# Patient Record
Sex: Female | Born: 2000 | Race: White | Hispanic: No | Marital: Single | Smoking: Never smoker
Health system: Southern US, Community
[De-identification: ages and names within clinical notes are randomized; demographics above are authoritative.]

## PROBLEM LIST (undated history)

## (undated) DIAGNOSIS — N159 Renal tubulo-interstitial disease, unspecified: Secondary | ICD-10-CM

## (undated) DIAGNOSIS — N390 Urinary tract infection, site not specified: Secondary | ICD-10-CM

## (undated) HISTORY — PX: CYST EXCISION: SHX5701

---
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
## 2022-03-12 ENCOUNTER — Encounter (HOSPITAL_COMMUNITY): Payer: Self-pay

## 2022-03-12 ENCOUNTER — Inpatient Hospital Stay (HOSPITAL_COMMUNITY): Payer: PRIVATE HEALTH INSURANCE

## 2022-03-12 ENCOUNTER — Inpatient Hospital Stay (HOSPITAL_COMMUNITY)
Admission: AD | Admit: 2022-03-12 | Discharge: 2022-03-15 | DRG: 833 | Disposition: A | Discharge status: Home / Self Care | Payer: PRIVATE HEALTH INSURANCE | Attending: Obstetrics and Gynecology | Admitting: Obstetrics and Gynecology

## 2022-03-12 ENCOUNTER — Other Ambulatory Visit: Payer: Self-pay

## 2022-03-12 DIAGNOSIS — O2302 Infections of kidney in pregnancy, second trimester: Principal | ICD-10-CM | POA: Diagnosis present

## 2022-03-12 DIAGNOSIS — B961 Klebsiella pneumoniae [K. pneumoniae] as the cause of diseases classified elsewhere: Secondary | ICD-10-CM | POA: Diagnosis present

## 2022-03-12 DIAGNOSIS — O99012 Anemia complicating pregnancy, second trimester: Secondary | ICD-10-CM | POA: Diagnosis present

## 2022-03-12 DIAGNOSIS — Z3A18 18 weeks gestation of pregnancy: Secondary | ICD-10-CM | POA: Diagnosis not present

## 2022-03-12 DIAGNOSIS — Z3490 Encounter for supervision of normal pregnancy, unspecified, unspecified trimester: Secondary | ICD-10-CM

## 2022-03-12 LAB — CBC WITH DIFFERENTIAL/PLATELET
Abs Immature Granulocytes: 0.06 10*3/uL (ref 0.00–0.07)
Basophils Absolute: 0 10*3/uL (ref 0.0–0.1)
Basophils Relative: 0 %
Eosinophils Absolute: 0 10*3/uL (ref 0.0–0.5)
Eosinophils Relative: 0 %
HCT: 31.3 % — ABNORMAL LOW (ref 36.0–46.0)
Hemoglobin: 10.7 g/dL — ABNORMAL LOW (ref 12.0–15.0)
Immature Granulocytes: 0 %
Lymphocytes Relative: 5 %
Lymphs Abs: 0.8 10*3/uL (ref 0.7–4.0)
MCH: 31.5 pg (ref 26.0–34.0)
MCHC: 34.2 g/dL (ref 30.0–36.0)
MCV: 92.1 fL (ref 80.0–100.0)
Monocytes Absolute: 0.7 10*3/uL (ref 0.1–1.0)
Monocytes Relative: 5 %
Neutro Abs: 13.1 10*3/uL — ABNORMAL HIGH (ref 1.7–7.7)
Neutrophils Relative %: 90 %
Platelets: 219 10*3/uL (ref 150–400)
RBC: 3.4 MIL/uL — ABNORMAL LOW (ref 3.87–5.11)
RDW: 12 % (ref 11.5–15.5)
WBC: 14.7 10*3/uL — ABNORMAL HIGH (ref 4.0–10.5)
nRBC: 0 % (ref 0.0–0.2)

## 2022-03-12 LAB — URINALYSIS, ROUTINE W REFLEX MICROSCOPIC
Bilirubin Urine: NEGATIVE
Glucose, UA: NEGATIVE mg/dL
Ketones, ur: NEGATIVE mg/dL
Nitrite: POSITIVE — AB
Protein, ur: NEGATIVE mg/dL
Specific Gravity, Urine: 1.016 (ref 1.005–1.030)
WBC, UA: >50 WBC/hpf — ABNORMAL HIGH (ref 0–5)
pH: 6 (ref 5.0–8.0)

## 2022-03-12 LAB — COMPREHENSIVE METABOLIC PANEL
ALT: 12 U/L (ref 0–44)
AST: 16 U/L (ref 15–41)
Albumin: 3.3 g/dL — ABNORMAL LOW (ref 3.5–5.0)
Alkaline Phosphatase: 42 U/L (ref 38–126)
Anion gap: 5 (ref 5–15)
BUN: 6 mg/dL (ref 6–20)
CO2: 22 mmol/L (ref 22–32)
Calcium: 8.9 mg/dL (ref 8.9–10.3)
Chloride: 108 mmol/L (ref 98–111)
Creatinine, Ser: 0.47 mg/dL (ref 0.44–1.00)
GFR, Estimated: >60 mL/min (ref >60)
Glucose, Bld: 87 mg/dL (ref 70–99)
Potassium: 4.1 mmol/L (ref 3.5–5.1)
Sodium: 135 mmol/L (ref 135–145)
Total Bilirubin: 0.5 mg/dL (ref 0.3–1.2)
Total Protein: 5.7 g/dL — ABNORMAL LOW (ref 6.5–8.1)

## 2022-03-12 LAB — TYPE AND SCREEN
ABO/RH(D): O POS
Antibody Screen: NEGATIVE

## 2022-03-12 MED ORDER — CALCIUM CARBONATE ANTACID 500 MG PO CHEW: 2.0000 | CHEWABLE_TABLET | ORAL | Status: DC | PRN

## 2022-03-12 MED ORDER — ZOLPIDEM TARTRATE 5 MG PO TABS
5.0000 mg | ORAL_TABLET | Freq: Every evening | ORAL | Status: DC | PRN
  Administered 2022-03-13: 5 mg via ORAL
  Filled 2022-03-12: qty 1

## 2022-03-12 MED ORDER — FAMOTIDINE 20 MG PO TABS: 20.0000 mg | ORAL_TABLET | Freq: Two times a day (BID) | ORAL | Status: DC | PRN

## 2022-03-12 MED ORDER — SODIUM CHLORIDE 0.9 % IV BOLUS
1000.0000 mL | Freq: Once | INTRAVENOUS | Status: AC
  Administered 2022-03-12: 1000 mL via INTRAVENOUS

## 2022-03-12 MED ORDER — ONDANSETRON 4 MG PO TBDP
4.0000 mg | ORAL_TABLET | Freq: Three times a day (TID) | ORAL | Status: DC | PRN
  Administered 2022-03-12 – 2022-03-14 (×4): 4 mg via ORAL
  Filled 2022-03-12 (×4): qty 1

## 2022-03-12 MED ORDER — OXYCODONE-ACETAMINOPHEN 5-325 MG PO TABS
2.0000 | ORAL_TABLET | Freq: Once | ORAL | Status: AC
  Administered 2022-03-12: 2 via ORAL
  Filled 2022-03-12: qty 2

## 2022-03-12 MED ORDER — OXYCODONE HCL 5 MG PO TABS
5.0000 mg | ORAL_TABLET | ORAL | Status: DC | PRN
  Administered 2022-03-12: 5 mg via ORAL
  Filled 2022-03-12 (×2): qty 1

## 2022-03-12 MED ORDER — SODIUM CHLORIDE 0.9 % IV SOLN
2.0000 g | INTRAVENOUS | Status: DC
  Administered 2022-03-12 – 2022-03-13 (×2): 2 g via INTRAVENOUS
  Filled 2022-03-12 (×4): qty 20

## 2022-03-12 MED ORDER — PRENATAL MULTIVITAMIN CH
1.0000 | ORAL_TABLET | Freq: Every day | ORAL | Status: DC
  Administered 2022-03-13 – 2022-03-14 (×2): 1 via ORAL
  Filled 2022-03-12 (×2): qty 1

## 2022-03-12 MED ORDER — DOCUSATE SODIUM 100 MG PO CAPS
100.0000 mg | ORAL_CAPSULE | Freq: Every day | ORAL | Status: DC
  Administered 2022-03-14: 100 mg via ORAL
  Filled 2022-03-12 (×2): qty 1

## 2022-03-12 MED ORDER — ACETAMINOPHEN 500 MG PO TABS
1000.0000 mg | ORAL_TABLET | Freq: Four times a day (QID) | ORAL | Status: DC | PRN
  Administered 2022-03-12 – 2022-03-15 (×8): 1000 mg via ORAL
  Filled 2022-03-12 (×8): qty 2

## 2022-03-12 NOTE — H&P (Signed)
Alexis Padilla is a 21 y.o. female presenting for Pyelo. Please see triage HPI for more details. Reports constant back pain improved by pain meds in MAU. No overt dysuria, f/c, or other pain. ?OB History   ? ? Gravida  ?2  ? Para  ?   ? Term  ?   ? Preterm  ?   ? AB  ?1  ? Living  ?   ?  ? ? SAB  ?1  ? IAB  ?   ? Ectopic  ?   ? Multiple  ?   ? Live Births  ?   ?   ?  ?  ? ?History reviewed. No pertinent past medical history. ?Past Surgical History:  ?Procedure Laterality Date  ? CYST EXCISION    ? ?Family History: family history is not on file. ?Social History:  reports that she has never smoked. She has been exposed to tobacco smoke. She has never used smokeless tobacco. She reports current drug use. Drug: Marijuana. She reports that she does not drink alcohol. ?Review of Systems ?History ?  ?Blood pressure (!) 109/48, pulse 89, temperature 98.2 ?F (36.8 ?C), temperature source Oral, resp. rate 16, height 5\' 1"  (1.549 m), weight 58.4 kg, SpO2 99 %. ?Exam ?Physical Exam  ?NAD, A&O ?NWOB ?Abd soft, nondistended, gravid ?+ Left CVA tenderness - right none ? ?Prenatal labs: ?ABO, Rh: --/--/PENDING (05/01 1944) ?Antibody: PENDING (05/01 1944) ?Rubella:   ?RPR:    ?HBsAg:    ?HIV:    ?GBS:    ? ?Assessment/Plan: ?21 yo G2P0 presenting at 18.0 wga with pyelo.  ?AF but + CVA tenderness and UA dirty ?Daily FHT ?Daily CBC ?Ceftriaxone 2g q 24 hours ?F/u UCX ?Encouraged hydration  ? ? ?26 ?03/12/2022, 8:25 PM ? ? ? ? ?

## 2022-03-12 NOTE — MAU Provider Note (Addendum)
?History  ?  ? ?CSN: [id number] ? ?Arrival date and time: 03/12/22 1724 ? ? None  ?  ? ?Chief Complaint  ?Patient presents with  ? Back Pain  ? ?HPI ?This is a 21 year old G2, P0 at 18 weeks and 0 days who is seen with right flank pain radiates into her right abdomen.  Her pain started this morning.  Pain is fairly constant but waxes and wanes.  She tried to take Tylenol at the muscle relaxer, which did not help.  She denies fevers, chills, nausea, vomiting, diarrhea, constipation, hematuria, decreased appetite.  The pain is not improved with food or eating. ? ?OB History   ? ? Gravida  ?2  ? Para  ?   ? Term  ?   ? Preterm  ?   ? AB  ?1  ? Living  ?   ?  ? ? SAB  ?1  ? IAB  ?   ? Ectopic  ?   ? Multiple  ?   ? Live Births  ?   ?   ?  ?  ? ? ?History reviewed. No pertinent past medical history. ? ?Past Surgical History:  ?Procedure Laterality Date  ? CYST EXCISION    ? ? ?History reviewed. No pertinent family history. ? ?Social History  ? ?Tobacco Use  ? Smoking status: Never  ?  Passive exposure: Yes  ? Smokeless tobacco: Never  ?Vaping Use  ? Vaping Use: Every day  ?Substance Use Topics  ? Alcohol use: No  ? Drug use: Yes  ?  Types: Marijuana  ? ? ?Allergies:  ?Allergies  ?Allergen Reactions  ? Latex Rash  ? ? ?Medications Prior to Admission  ?Medication Sig Dispense Refill Last Dose  ? cephALEXin (KEFLEX) 500 MG capsule Take 1 capsule (500 mg total) by mouth 2 (two) times daily. 14 capsule 0   ? ibuprofen (ADVIL,MOTRIN) 200 MG tablet Take 400 mg by mouth every 6 (six) hours as needed for mild pain.     ? ibuprofen (ADVIL,MOTRIN) 400 MG tablet Take 1 tablet (400 mg total) by mouth every 6 (six) hours as needed. 30 tablet 0   ? methocarbamol (ROBAXIN) 500 MG tablet Take 1 tablet (500 mg total) by mouth 3 (three) times daily. 21 tablet 0   ? ? ?Review of Systems ?Physical Exam  ? ?Blood pressure (!) 109/48, pulse 89, temperature 98.2 ?F (36.8 ?C), temperature source Oral, resp. rate 16, height 5\' 1"  (1.549 m), weight  58.4 kg, SpO2 99 %. ? ?Physical Exam ?Vitals reviewed.  ?Constitutional:   ?   Appearance: Normal appearance.  ?HENT:  ?   Head: Normocephalic and atraumatic.  ?Cardiovascular:  ?   Rate and Rhythm: Normal rate and regular rhythm.  ?   Pulses: Normal pulses.  ?   Heart sounds: Normal heart sounds.  ?Pulmonary:  ?   Effort: Pulmonary effort is normal.  ?   Breath sounds: Normal breath sounds.  ?Abdominal:  ?   General: Abdomen is flat. There is no distension.  ?   Palpations: Abdomen is soft. There is no mass.  ?   Tenderness: There is no abdominal tenderness. There is right CVA tenderness. There is no left CVA tenderness, guarding or rebound.  ?   Hernia: No hernia is present.  ?Skin: ?   General: Skin is warm and dry.  ?   Capillary Refill: Capillary refill takes less than 2 seconds.  ?Neurological:  ?   General: No focal  deficit present.  ?   Mental Status: She is alert.  ?Psychiatric:     ?   Mood and Affect: Mood normal.     ?   Behavior: Behavior normal.     ?   Thought Content: Thought content normal.     ?   Judgment: Judgment normal.  ? ?Results for orders placed or performed during the hospital encounter of 03/12/22 (from the past 24 hour(s))  ?Urinalysis, Routine w reflex microscopic     Status: Abnormal  ? Collection Time: 03/12/22  5:24 PM  ?Result Value Ref Range  ? Color, Urine YELLOW YELLOW  ? APPearance CLOUDY (A) CLEAR  ? Specific Gravity, Urine 1.016 1.005 - 1.030  ? pH 6.0 5.0 - 8.0  ? Glucose, UA NEGATIVE NEGATIVE mg/dL  ? Hgb urine dipstick SMALL (A) NEGATIVE  ? Bilirubin Urine NEGATIVE NEGATIVE  ? Ketones, ur NEGATIVE NEGATIVE mg/dL  ? Protein, ur NEGATIVE NEGATIVE mg/dL  ? Nitrite POSITIVE (A) NEGATIVE  ? Leukocytes,Ua LARGE (A) NEGATIVE  ? RBC / HPF 21-50 0 - 5 RBC/hpf  ? WBC, UA >50 (H) 0 - 5 WBC/hpf  ? Bacteria, UA MANY (A) NONE SEEN  ? Squamous Epithelial / LPF 6-10 0 - 5  ? WBC Clumps PRESENT   ? Mucus PRESENT   ?Comprehensive metabolic panel     Status: Abnormal  ? Collection Time:  03/12/22  6:11 PM  ?Result Value Ref Range  ? Sodium 135 135 - 145 mmol/L  ? Potassium 4.1 3.5 - 5.1 mmol/L  ? Chloride 108 98 - 111 mmol/L  ? CO2 22 22 - 32 mmol/L  ? Glucose, Bld 87 70 - 99 mg/dL  ? BUN 6 6 - 20 mg/dL  ? Creatinine, Ser 0.47 0.44 - 1.00 mg/dL  ? Calcium 8.9 8.9 - 10.3 mg/dL  ? Total Protein 5.7 (L) 6.5 - 8.1 g/dL  ? Albumin 3.3 (L) 3.5 - 5.0 g/dL  ? AST 16 15 - 41 U/L  ? ALT 12 0 - 44 U/L  ? Alkaline Phosphatase 42 38 - 126 U/L  ? Total Bilirubin 0.5 0.3 - 1.2 mg/dL  ? GFR, Estimated >60 >60 mL/min  ? Anion gap 5 5 - 15  ?CBC with Differential/Platelet     Status: Abnormal  ? Collection Time: 03/12/22  6:11 PM  ?Result Value Ref Range  ? WBC 14.7 (H) 4.0 - 10.5 K/uL  ? RBC 3.40 (L) 3.87 - 5.11 MIL/uL  ? Hemoglobin 10.7 (L) 12.0 - 15.0 g/dL  ? HCT 31.3 (L) 36.0 - 46.0 %  ? MCV 92.1 80.0 - 100.0 fL  ? MCH 31.5 26.0 - 34.0 pg  ? MCHC 34.2 30.0 - 36.0 g/dL  ? RDW 12.0 11.5 - 15.5 %  ? Platelets 219 150 - 400 K/uL  ? nRBC 0.0 0.0 - 0.2 %  ? Neutrophils Relative % 90 %  ? Neutro Abs 13.1 (H) 1.7 - 7.7 K/uL  ? Lymphocytes Relative 5 %  ? Lymphs Abs 0.8 0.7 - 4.0 K/uL  ? Monocytes Relative 5 %  ? Monocytes Absolute 0.7 0.1 - 1.0 K/uL  ? Eosinophils Relative 0 %  ? Eosinophils Absolute 0.0 0.0 - 0.5 K/uL  ? Basophils Relative 0 %  ? Basophils Absolute 0.0 0.0 - 0.1 K/uL  ? Immature Granulocytes 0 %  ? Abs Immature Granulocytes 0.06 0.00 - 0.07 K/uL  ? ?US RENAL ? ?Result Date: 03/12/2022 ?CLINICAL DATA:  Eighteen weeks pregnant with right-sided back  pain. EXAM: RENAL / URINARY TRACT ULTRASOUND COMPLETE COMPARISON:  September 30, 2007 FINDINGS: Right Kidney: Renal measurements: 11.7 cm x 5.3 cm x 6.0 cm = volume: 196.5 mL. Echogenicity within normal limits. No mass is visualized. There is mild right-sided hydronephrosis. Left Kidney: Renal measurements: 11.5 cm x 5.2 cm x 5.4 cm = volume: 169.4 mL. Echogenicity within normal limits. No mass or hydronephrosis visualized. Bladder: Appears normal for degree of  bladder distention. Other: None. IMPRESSION: Mild right-sided hydronephrosis. Electronically Signed   By: Aram Candelahaddeus  Houston M.D.   On: 03/12/2022 18:59   ? ? ?MAU Course  ?Procedures ? ?MDM ?1800 With right CVA tenderness will need to rule out pyelonephritis and kidney stone.  Will check CBC, CMP, UA, renal ultrasound. ?We will give patient Percocet 10 mg ? ?Assessment and Plan  ? ?1. [redacted] weeks gestation of pregnancy   ?2. Pyelonephritis affecting pregnancy in second trimester   ? ?UTI consistent with infection. With CVA tenderness, likely early pyelo.  ?Rocephin 2g now ?Start IV ?NS ? ?Discussed patient with Dr Elon SpannerLeger, who agreed with admission.  ? ? ?Levie HeritageJacob J Vonita Calloway ?03/12/2022, 6:03 PM  ?

## 2022-03-12 NOTE — MAU Note (Signed)
Alexis Padilla is a 21 y.o. at [redacted]w[redacted]d here in MAU reporting: right sided back pain that radiates in to her abdomen. It started around 0400. Has taken 1 tylenol and 1 muscle relaxer with no relief. Pain is constant but the intensity changes. No bleeding, LOF, or discharge. ? ?Onset of complaint: today ? ?Pain score: 6/10 ? ?Vitals:  ? 03/12/22 1749  ?BP: (!) 109/48  ?Pulse: 89  ?Resp: 16  ?Temp: 98.2 ?F (36.8 ?C)  ?SpO2: 99%  ?   ?FHT:162 ? ?Lab orders placed from triage: UA ? ?

## 2022-03-13 LAB — CBC
HCT: 26.8 % — ABNORMAL LOW (ref 36.0–46.0)
Hemoglobin: 9.4 g/dL — ABNORMAL LOW (ref 12.0–15.0)
MCH: 31.9 pg (ref 26.0–34.0)
MCHC: 35.1 g/dL (ref 30.0–36.0)
MCV: 90.8 fL (ref 80.0–100.0)
Platelets: 185 10*3/uL (ref 150–400)
RBC: 2.95 MIL/uL — ABNORMAL LOW (ref 3.87–5.11)
RDW: 11.9 % (ref 11.5–15.5)
WBC: 13.3 10*3/uL — ABNORMAL HIGH (ref 4.0–10.5)
nRBC: 0 % (ref 0.0–0.2)

## 2022-03-13 MED ORDER — FERROUS SULFATE 325 (65 FE) MG PO TABS
325.0000 mg | ORAL_TABLET | Freq: Every day | ORAL | Status: DC
  Administered 2022-03-14: 325 mg via ORAL
  Filled 2022-03-13: qty 1

## 2022-03-13 MED ORDER — CYCLOBENZAPRINE HCL 10 MG PO TABS
10.0000 mg | ORAL_TABLET | Freq: Once | ORAL | Status: AC
  Administered 2022-03-13: 10 mg via ORAL
  Filled 2022-03-13: qty 1

## 2022-03-13 MED ORDER — OXYCODONE HCL 5 MG PO TABS
5.0000 mg | ORAL_TABLET | ORAL | Status: DC | PRN
  Administered 2022-03-13 (×3): 10 mg via ORAL
  Administered 2022-03-14: 5 mg via ORAL
  Filled 2022-03-13: qty 1
  Filled 2022-03-13 (×3): qty 2

## 2022-03-13 NOTE — Progress Notes (Addendum)
Patient ID: DENECIA BRUNETTE, female   DOB: June 07, 2001, 21 y.o.   MRN: [id number] ? ?No c/o this AM.  Right flank pain is improved with medication; 10 mg oxi at 0600.  She denies CTX or VB.  No CP/SOB.  No N/V with exception of last night when Abx were initiated. ? ? ?  03/13/2022  ?  8:15 AM 03/13/2022  ?  6:23 AM 03/12/2022  ? 11:44 PM  ?Vitals with BMI  ?Systolic 99 94 108  ?Diastolic 43 45 41  ?Pulse 70 79 80  ? ? ?  Latest Ref Rng & Units 03/13/2022  ?  5:03 AM 03/12/2022  ?  6:11 PM 10/16/2019  ?  2:05 PM  ?CBC  ?WBC 4.0 - 10.5 K/uL 13.3   14.7   8.5    ?Hemoglobin 12.0 - 15.0 g/dL 9.4   35.3   61.4    ?Hematocrit 36.0 - 46.0 % 26.8   31.3   41.2    ?Platelets 150 - 400 K/uL 185   219   227    ? ?Gen: A&O x 3 ?Abd: soft, gravid ?MSK: right CVA tenderness; per patient improved from last exam ?Ext: no c/c/e ? ?HD2: 20yo G2P0 at [redacted]w[redacted]d with right pyelo ?-Ceftriaxone 2g q 24h ?-Continue pain control; pain improved ?-Continue daily labs; WBC 14.7->13.3 ?-Afebrile throughout stay; continue to monitor T ?-Daily FHT ?-Anemia-FeSO4 started ?-Awaiting U cx to determine po abx on d/c ? ?Mitchel Honour, DO ?

## 2022-03-14 LAB — CBC WITH DIFFERENTIAL/PLATELET
Abs Immature Granulocytes: 0.02 10*3/uL (ref 0.00–0.07)
Basophils Absolute: 0 10*3/uL (ref 0.0–0.1)
Basophils Relative: 0 %
Eosinophils Absolute: 0.1 10*3/uL (ref 0.0–0.5)
Eosinophils Relative: 1 %
HCT: 25.4 % — ABNORMAL LOW (ref 36.0–46.0)
Hemoglobin: 8.7 g/dL — ABNORMAL LOW (ref 12.0–15.0)
Immature Granulocytes: 0 %
Lymphocytes Relative: 29 %
Lymphs Abs: 2 10*3/uL (ref 0.7–4.0)
MCH: 31.9 pg (ref 26.0–34.0)
MCHC: 34.3 g/dL (ref 30.0–36.0)
MCV: 93 fL (ref 80.0–100.0)
Monocytes Absolute: 0.7 10*3/uL (ref 0.1–1.0)
Monocytes Relative: 11 %
Neutro Abs: 4 10*3/uL (ref 1.7–7.7)
Neutrophils Relative %: 59 %
Platelets: 165 10*3/uL (ref 150–400)
RBC: 2.73 MIL/uL — ABNORMAL LOW (ref 3.87–5.11)
RDW: 12.1 % (ref 11.5–15.5)
WBC: 6.8 10*3/uL (ref 4.0–10.5)
nRBC: 0 % (ref 0.0–0.2)

## 2022-03-14 LAB — URINE CULTURE: Culture: >=100000 — AB

## 2022-03-14 MED ORDER — SULFAMETHOXAZOLE-TRIMETHOPRIM 800-160 MG PO TABS
1.0000 | ORAL_TABLET | Freq: Two times a day (BID) | ORAL | Status: DC
  Administered 2022-03-14 (×2): 1 via ORAL
  Filled 2022-03-14 (×3): qty 1

## 2022-03-14 NOTE — Progress Notes (Signed)
Antepartum Progress Note ? ? ?Alexis Padilla is a 21 y.o. female G2P0010 that is admitted to [facility] for pyelonephritis.  Overnight, she reports no acute events.  Her flank pain is improved. Took 5 mg oxycodone overnight (was previously using 10 mg dosing).  Nausea has improved as well.  ? ?Admits fetal movement, denies contractions, denies leakage of fluid, denies vaginal bleeding. ? ?History ?  ?Blood pressure (!) 104/50, pulse 76, temperature 98.3 ?F (36.8 ?C), temperature source Oral, resp. rate 16, height 5\' 1"  (1.549 m), weight 58.4 kg, SpO2 100 %. ?Exam ? ?Physical Exam: ?Gen: Alert, well appearing, no distress ?Chest: nonlabored breathing ?CV: no peripheral edema ?Abdomen: gravid, soft and nontender ?Ext: no evidence of DVT ? ?FHT: daily dopp tones ? ?Assessment/Plan: ?Admitted to [facility] for pyelonephritis ?Ceftriaxone 2g q 24h ?WBC improved 14.7 > 13.3 > 6.8 this AM ?Afebrile since admission ?Urine Culture prelim with GNR, susceptibilities pending.  Will transition to oral when result is finalized. ?Pain control - oxycodone, tylenol ?Anemia - PO Fe ?Daily dopp tones ?Diet: regular ?DVT Ppx: SCDs ?  ? ?[facility] ?03/14/2022, 7:20 AM ? ? ? ? ?

## 2022-03-15 LAB — CBC WITH DIFFERENTIAL/PLATELET
Abs Immature Granulocytes: 0.03 10*3/uL (ref 0.00–0.07)
Basophils Absolute: 0 10*3/uL (ref 0.0–0.1)
Basophils Relative: 0 %
Eosinophils Absolute: 0.1 10*3/uL (ref 0.0–0.5)
Eosinophils Relative: 1 %
HCT: 25.7 % — ABNORMAL LOW (ref 36.0–46.0)
Hemoglobin: 8.9 g/dL — ABNORMAL LOW (ref 12.0–15.0)
Immature Granulocytes: 0 %
Lymphocytes Relative: 19 %
Lymphs Abs: 1.3 10*3/uL (ref 0.7–4.0)
MCH: 31.8 pg (ref 26.0–34.0)
MCHC: 34.6 g/dL (ref 30.0–36.0)
MCV: 91.8 fL (ref 80.0–100.0)
Monocytes Absolute: 0.5 10*3/uL (ref 0.1–1.0)
Monocytes Relative: 8 %
Neutro Abs: 4.8 10*3/uL (ref 1.7–7.7)
Neutrophils Relative %: 72 %
Platelets: 171 10*3/uL (ref 150–400)
RBC: 2.8 MIL/uL — ABNORMAL LOW (ref 3.87–5.11)
RDW: 11.8 % (ref 11.5–15.5)
WBC: 6.7 10*3/uL (ref 4.0–10.5)
nRBC: 0 % (ref 0.0–0.2)

## 2022-03-15 MED ORDER — SULFAMETHOXAZOLE-TRIMETHOPRIM 800-160 MG PO TABS: 1.0000 | ORAL_TABLET | Freq: Two times a day (BID) | ORAL | 0 refills | Status: AC

## 2022-03-15 MED ORDER — FERROUS SULFATE 325 (65 FE) MG PO TABS: 325.0000 mg | ORAL_TABLET | Freq: Every day | ORAL | 3 refills | Status: AC

## 2022-03-15 MED ORDER — PRENATAL MULTIVITAMIN CH: 1.0000 | ORAL_TABLET | Freq: Every day | ORAL | 3 refills | Status: AC

## 2022-03-15 NOTE — Progress Notes (Signed)
Feels good, wants to go home ?Nose bleed this morning, also some bleeding from gums with brushing. ? ?Today's Vitals  ? 03/15/22 0400 03/15/22 0531 03/15/22 0618 03/15/22 0741  ?BP:    (!) 108/55  ?Pulse:    79  ?Resp:    16  ?Temp:    98.3 ?F (36.8 ?C)  ?TempSrc:    Oral  ?SpO2:    100%  ?Weight:      ?Height:      ?PainSc: Asleep Asleep 5    ? ?Body mass index is 24.34 kg/m?Marland Kitchen ?Small nose bleed stopping with pressure ?Back without CVAT ? ?Results for orders placed or performed during the hospital encounter of 03/12/22 (from the past 72 hour(s))  ?Urinalysis, Routine w reflex microscopic     Status: Abnormal  ? Collection Time: 03/12/22  5:24 PM  ?Result Value Ref Range  ? Color, Urine YELLOW YELLOW  ? APPearance CLOUDY (A) CLEAR  ? Specific Gravity, Urine 1.016 1.005 - 1.030  ? pH 6.0 5.0 - 8.0  ? Glucose, UA NEGATIVE NEGATIVE mg/dL  ? Hgb urine dipstick SMALL (A) NEGATIVE  ? Bilirubin Urine NEGATIVE NEGATIVE  ? Ketones, ur NEGATIVE NEGATIVE mg/dL  ? Protein, ur NEGATIVE NEGATIVE mg/dL  ? Nitrite POSITIVE (A) NEGATIVE  ? Leukocytes,Ua LARGE (A) NEGATIVE  ? RBC / HPF 21-50 0 - 5 RBC/hpf  ? WBC, UA >50 (H) 0 - 5 WBC/hpf  ? Bacteria, UA MANY (A) NONE SEEN  ? Squamous Epithelial / LPF 6-10 0 - 5  ? WBC Clumps PRESENT   ? Mucus PRESENT   ?  Comment: Performed at [facility], [address] [place], [place] [address]  ?Urine Culture     Status: Abnormal  ? Collection Time: 03/12/22  5:24 PM  ? Specimen: Urine, Clean Catch  ?Result Value Ref Range  ? Specimen Description URINE, CLEAN CATCH   ? Special Requests    ?  NONE ?Performed at [facility], [address] [place], [place] [address] ?  ? Culture >=100,000 COLONIES/mL KLEBSIELLA PNEUMONIAE (A)   ? Report Status 03/14/2022 FINAL   ? Organism ID, Bacteria KLEBSIELLA PNEUMONIAE (A)   ?    Susceptibility  ? Klebsiella pneumoniae - MIC*  ?  AMPICILLIN RESISTANT Resistant   ?  CEFAZOLIN <=4 SENSITIVE Sensitive   ?  CEFEPIME <=0.12 SENSITIVE Sensitive   ?   CEFTRIAXONE <=0.25 SENSITIVE Sensitive   ?  CIPROFLOXACIN <=0.25 SENSITIVE Sensitive   ?  GENTAMICIN <=1 SENSITIVE Sensitive   ?  IMIPENEM <=0.25 SENSITIVE Sensitive   ?  NITROFURANTOIN 128 RESISTANT Resistant   ?  TRIMETH/SULFA <=20 SENSITIVE Sensitive   ?  AMPICILLIN/SULBACTAM 4 SENSITIVE Sensitive   ?  PIP/TAZO <=4 SENSITIVE Sensitive   ?  * >=100,000 COLONIES/mL KLEBSIELLA PNEUMONIAE  ?Comprehensive metabolic panel     Status: Abnormal  ? Collection Time: 03/12/22  6:11 PM  ?Result Value Ref Range  ? Sodium 135 135 - 145 mmol/L  ? Potassium 4.1 3.5 - 5.1 mmol/L  ? Chloride 108 98 - 111 mmol/L  ? CO2 22 22 - 32 mmol/L  ? Glucose, Bld 87 70 - 99 mg/dL  ?  Comment: Glucose reference range applies only to samples taken after fasting for at least 8 hours.  ? BUN 6 6 - 20 mg/dL  ? Creatinine, Ser 0.47 0.44 - 1.00 mg/dL  ? Calcium 8.9 8.9 - 10.3 mg/dL  ? Total Protein 5.7 (L) 6.5 - 8.1  g/dL  ? Albumin 3.3 (L) 3.5 - 5.0 g/dL  ? AST 16 15 - 41 U/L  ? ALT 12 0 - 44 U/L  ? Alkaline Phosphatase 42 38 - 126 U/L  ? Total Bilirubin 0.5 0.3 - 1.2 mg/dL  ? GFR, Estimated >60 >60 mL/min  ?  Comment: (NOTE) ?Calculated using the CKD-EPI Creatinine Equation (2021) ?  ? Anion gap 5 5 - 15  ?  Comment: Performed at [facility], [address], [place], [place] [address]  ?CBC with Differential/Platelet     Status: Abnormal  ? Collection Time: 03/12/22  6:11 PM  ?Result Value Ref Range  ? WBC 14.7 (H) 4.0 - 10.5 K/uL  ? RBC 3.40 (L) 3.87 - 5.11 MIL/uL  ? Hemoglobin 10.7 (L) 12.0 - 15.0 g/dL  ? HCT 31.3 (L) 36.0 - 46.0 %  ? MCV 92.1 80.0 - 100.0 fL  ? MCH 31.5 26.0 - 34.0 pg  ? MCHC 34.2 30.0 - 36.0 g/dL  ? RDW 12.0 11.5 - 15.5 %  ? Platelets 219 150 - 400 K/uL  ? nRBC 0.0 0.0 - 0.2 %  ? Neutrophils Relative % 90 %  ? Neutro Abs 13.1 (H) 1.7 - 7.7 K/uL  ? Lymphocytes Relative 5 %  ? Lymphs Abs 0.8 0.7 - 4.0 K/uL  ? Monocytes Relative 5 %  ? Monocytes Absolute 0.7 0.1 - 1.0 K/uL  ? Eosinophils Relative 0 %  ? Eosinophils Absolute  0.0 0.0 - 0.5 K/uL  ? Basophils Relative 0 %  ? Basophils Absolute 0.0 0.0 - 0.1 K/uL  ? Immature Granulocytes 0 %  ? Abs Immature Granulocytes 0.06 0.00 - 0.07 K/uL  ?  Comment: Performed at [facility], [address] [place], [place] [address]  ?Type and screen [facility]     Status: None  ? Collection Time: 03/12/22  7:44 PM  ?Result Value Ref Range  ? ABO/RH(D) O POS   ? Antibody Screen NEG   ? Sample Expiration    ?  03/15/2022,2359 ?Performed at [facility], [address] [place], [place] [address] ?  ?CBC     Status: Abnormal  ? Collection Time: 03/13/22  5:03 AM  ?Result Value Ref Range  ? WBC 13.3 (H) 4.0 - 10.5 K/uL  ? RBC 2.95 (L) 3.87 - 5.11 MIL/uL  ? Hemoglobin 9.4 (L) 12.0 - 15.0 g/dL  ? HCT 26.8 (L) 36.0 - 46.0 %  ? MCV 90.8 80.0 - 100.0 fL  ? MCH 31.9 26.0 - 34.0 pg  ? MCHC 35.1 30.0 - 36.0 g/dL  ? RDW 11.9 11.5 - 15.5 %  ? Platelets 185 150 - 400 K/uL  ? nRBC 0.0 0.0 - 0.2 %  ?  Comment: Performed at [facility], [address] [place], [place] [address]  ?CBC with Differential/Platelet     Status: Abnormal  ? Collection Time: 03/14/22  4:16 AM  ?Result Value Ref Range  ? WBC 6.8 4.0 - 10.5 K/uL  ? RBC 2.73 (L) 3.87 - 5.11 MIL/uL  ? Hemoglobin 8.7 (L) 12.0 - 15.0 g/dL  ? HCT 25.4 (L) 36.0 - 46.0 %  ? MCV 93.0 80.0 - 100.0 fL  ? MCH 31.9 26.0 - 34.0 pg  ? MCHC 34.3 30.0 - 36.0 g/dL  ? RDW 12.1 11.5 - 15.5 %  ? Platelets 165 150 - 400 K/uL  ? nRBC 0.0 0.0 - 0.2 %  ?  Neutrophils Relative % 59 %  ? Neutro Abs 4.0 1.7 - 7.7 K/uL  ? Lymphocytes Relative 29 %  ? Lymphs Abs 2.0 0.7 - 4.0 K/uL  ? Monocytes Relative 11 %  ? Monocytes Absolute 0.7 0.1 - 1.0 K/uL  ? Eosinophils Relative 1 %  ? Eosinophils Absolute 0.1 0.0 - 0.5 K/uL  ? Basophils Relative 0 %  ? Basophils Absolute 0.0 0.0 - 0.1 K/uL  ? Immature Granulocytes 0 %  ? Abs Immature Granulocytes 0.02 0.00 - 0.07 K/uL  ?  Comment: Performed at [facility], [address] [place], [place] [address]   ?CBC with Differential/Platelet     Status: Abnormal  ? Collection Time: 03/15/22  7:04 AM  ?Result Value Ref Range  ? WBC 6.7 4.0 - 10.5 K/uL  ? RBC 2.80 (L) 3.87 - 5.11 MIL/uL  ? Hemoglobin 8.9 (L) 12.0 - 15.0 g/dL  ? HCT 25.7 (L) 36.0 - 46.0 %  ? MCV 91.8 80.0 - 100.0 fL  ? MCH 31.8 26.0 - 34.0 pg  ? MCHC 34.6 30.0 - 36.0 g/dL  ? RDW 11.8 11.5 - 15.5 %  ? Platelets 171 150 - 400 K/uL  ? nRBC 0.0 0.0 - 0.2 %  ? Neutrophils Relative % 72 %  ? Neutro Abs 4.8 1.7 - 7.7 K/uL  ? Lymphocytes Relative 19 %  ? Lymphs Abs 1.3 0.7 - 4.0 K/uL  ? Monocytes Relative 8 %  ? Monocytes Absolute 0.5 0.1 - 1.0 K/uL  ? Eosinophils Relative 1 %  ? Eosinophils Absolute 0.1 0.0 - 0.5 K/uL  ? Basophils Relative 0 %  ? Basophils Absolute 0.0 0.0 - 0.1 K/uL  ? Immature Granulocytes 0 %  ? Abs Immature Granulocytes 0.03 0.00 - 0.07 K/uL  ?  Comment: Performed at [facility], [address] [place], [place] [address]  ?  ? ?A/P: 18 3/7 weeks ?        Pyelonephritis responding to treatement ?          Continue Bactrim for 2 weeks total ? ?        Anemia-ferrous sulfate ?           D/W possible IV iron infusion later in pregnancy ? ?        FU next week in office ?

## 2022-03-15 NOTE — Progress Notes (Signed)
Discharge instructions and prescriptions given to pt. Discussed signs and symptoms to report to the MD, upcoming appointments, and meds. Pt verbalizes understanding and has no questions or concerns at this time. Pt discharged home from hospital in stable condition. 

## 2022-03-15 NOTE — Discharge Summary (Signed)
Physician Discharge Summary  ?Patient ID: ?Alexis Padilla ?MRN: [id number] ?DOB/AGE: 2000/12/26 21 y.o. ? ?Admit date: 03/12/2022 ?Discharge date: 03/15/2022 ? ?Admission Diagnoses:pyelonephritis ? ?Discharge Diagnoses:  ?Principal Problem: ?  Pregnancy ?pyelonephritis ? ?Discharged Condition: good ? ?Hospital Course: admitted for IV fluids, ceftriaxone, and pain control. She responds to treatment. Urine culture grows klebsiella sensitive to Bactrim. She is changed to Bactrim and is tolerating well. She is also anemic and will be treated with oral ferrous sulfate. ? ?Consults: None ? ?Significant Diagnostic Studies: labs:  ?Results for orders placed or performed during the hospital encounter of 03/12/22 (from the past 72 hour(s))  ?Urinalysis, Routine w reflex microscopic     Status: Abnormal  ? Collection Time: 03/12/22  5:24 PM  ?Result Value Ref Range  ? Color, Urine YELLOW YELLOW  ? APPearance CLOUDY (A) CLEAR  ? Specific Gravity, Urine 1.016 1.005 - 1.030  ? pH 6.0 5.0 - 8.0  ? Glucose, UA NEGATIVE NEGATIVE mg/dL  ? Hgb urine dipstick SMALL (A) NEGATIVE  ? Bilirubin Urine NEGATIVE NEGATIVE  ? Ketones, ur NEGATIVE NEGATIVE mg/dL  ? Protein, ur NEGATIVE NEGATIVE mg/dL  ? Nitrite POSITIVE (A) NEGATIVE  ? Leukocytes,Ua LARGE (A) NEGATIVE  ? RBC / HPF 21-50 0 - 5 RBC/hpf  ? WBC, UA >50 (H) 0 - 5 WBC/hpf  ? Bacteria, UA MANY (A) NONE SEEN  ? Squamous Epithelial / LPF 6-10 0 - 5  ? WBC Clumps PRESENT   ? Mucus PRESENT   ?  Comment: Performed at [facility], [address], [place], [place] [address]  ?Urine Culture     Status: Abnormal  ? Collection Time: 03/12/22  5:24 PM  ? Specimen: Urine, Clean Catch  ?Result Value Ref Range  ? Specimen Description URINE, CLEAN CATCH   ? Special Requests    ?  NONE ?Performed at [facility] Lab, [address], [place], [place] [address] ?  ? Culture >=100,000 COLONIES/mL KLEBSIELLA PNEUMONIAE (A)   ? Report Status 03/14/2022 FINAL   ? Organism ID, Bacteria KLEBSIELLA  PNEUMONIAE (A)   ?    Susceptibility  ? Klebsiella pneumoniae - MIC*  ?  AMPICILLIN RESISTANT Resistant   ?  CEFAZOLIN <=4 SENSITIVE Sensitive   ?  CEFEPIME <=0.12 SENSITIVE Sensitive   ?  CEFTRIAXONE <=0.25 SENSITIVE Sensitive   ?  CIPROFLOXACIN <=0.25 SENSITIVE Sensitive   ?  GENTAMICIN <=1 SENSITIVE Sensitive   ?  IMIPENEM <=0.25 SENSITIVE Sensitive   ?  NITROFURANTOIN 128 RESISTANT Resistant   ?  TRIMETH/SULFA <=20 SENSITIVE Sensitive   ?  AMPICILLIN/SULBACTAM 4 SENSITIVE Sensitive   ?  PIP/TAZO <=4 SENSITIVE Sensitive   ?  * >=100,000 COLONIES/mL KLEBSIELLA PNEUMONIAE  ?Comprehensive metabolic panel     Status: Abnormal  ? Collection Time: 03/12/22  6:11 PM  ?Result Value Ref Range  ? Sodium 135 135 - 145 mmol/L  ? Potassium 4.1 3.5 - 5.1 mmol/L  ? Chloride 108 98 - 111 mmol/L  ? CO2 22 22 - 32 mmol/L  ? Glucose, Bld 87 70 - 99 mg/dL  ?  Comment: Glucose reference range applies only to samples taken after fasting for at least 8 hours.  ? BUN 6 6 - 20 mg/dL  ? Creatinine, Ser 0.47 0.44 - 1.00 mg/dL  ? Calcium 8.9 8.9 - 10.3 mg/dL  ? Total Protein 5.7 (L) 6.5 - 8.1 g/dL  ? Albumin 3.3 (L) 3.5 - 5.0 g/dL  ? AST 16 15 -  41 U/L  ? ALT 12 0 - 44 U/L  ? Alkaline Phosphatase 42 38 - 126 U/L  ? Total Bilirubin 0.5 0.3 - 1.2 mg/dL  ? GFR, Estimated >60 >60 mL/min  ?  Comment: (NOTE) ?Calculated using the CKD-EPI Creatinine Equation (2021) ?  ? Anion gap 5 5 - 15  ?  Comment: Performed at [facility], [address] [place], [place] [address]  ?CBC with Differential/Platelet     Status: Abnormal  ? Collection Time: 03/12/22  6:11 PM  ?Result Value Ref Range  ? WBC 14.7 (H) 4.0 - 10.5 K/uL  ? RBC 3.40 (L) 3.87 - 5.11 MIL/uL  ? Hemoglobin 10.7 (L) 12.0 - 15.0 g/dL  ? HCT 31.3 (L) 36.0 - 46.0 %  ? MCV 92.1 80.0 - 100.0 fL  ? MCH 31.5 26.0 - 34.0 pg  ? MCHC 34.2 30.0 - 36.0 g/dL  ? RDW 12.0 11.5 - 15.5 %  ? Platelets 219 150 - 400 K/uL  ? nRBC 0.0 0.0 - 0.2 %  ? Neutrophils Relative % 90 %  ? Neutro Abs 13.1 (H) 1.7 -  7.7 K/uL  ? Lymphocytes Relative 5 %  ? Lymphs Abs 0.8 0.7 - 4.0 K/uL  ? Monocytes Relative 5 %  ? Monocytes Absolute 0.7 0.1 - 1.0 K/uL  ? Eosinophils Relative 0 %  ? Eosinophils Absolute 0.0 0.0 - 0.5 K/uL  ? Basophils Relative 0 %  ? Basophils Absolute 0.0 0.0 - 0.1 K/uL  ? Immature Granulocytes 0 %  ? Abs Immature Granulocytes 0.06 0.00 - 0.07 K/uL  ?  Comment: Performed at [facility], [address] [place], [place] [address]  ?Type and screen [facility]     Status: None  ? Collection Time: 03/12/22  7:44 PM  ?Result Value Ref Range  ? ABO/RH(D) O POS   ? Antibody Screen NEG   ? Sample Expiration    ?  03/15/2022,2359 ?Performed at [facility], [address] [place], [place] [address] ?  ?CBC     Status: Abnormal  ? Collection Time: 03/13/22  5:03 AM  ?Result Value Ref Range  ? WBC 13.3 (H) 4.0 - 10.5 K/uL  ? RBC 2.95 (L) 3.87 - 5.11 MIL/uL  ? Hemoglobin 9.4 (L) 12.0 - 15.0 g/dL  ? HCT 26.8 (L) 36.0 - 46.0 %  ? MCV 90.8 80.0 - 100.0 fL  ? MCH 31.9 26.0 - 34.0 pg  ? MCHC 35.1 30.0 - 36.0 g/dL  ? RDW 11.9 11.5 - 15.5 %  ? Platelets 185 150 - 400 K/uL  ? nRBC 0.0 0.0 - 0.2 %  ?  Comment: Performed at [facility]  Lab, [address] [place], [place] [address]  ?CBC with Differential/Platelet     Status: Abnormal  ? Collection Time: 03/14/22  4:16 AM  ?Result Value Ref Range  ? WBC 6.8 4.0 - 10.5 K/uL  ? RBC 2.73 (L) 3.87 - 5.11 MIL/uL  ? Hemoglobin 8.7 (L) 12.0 - 15.0 g/dL  ? HCT 25.4 (L) 36.0 - 46.0 %  ? MCV 93.0 80.0 - 100.0 fL  ? MCH 31.9 26.0 - 34.0 pg  ? MCHC 34.3 30.0 - 36.0 g/dL  ? RDW 12.1 11.5 - 15.5 %  ? Platelets 165 150 - 400 K/uL  ? nRBC 0.0 0.0 - 0.2 %  ? Neutrophils Relative % 59 %  ? Neutro Abs 4.0 1.7 - 7.7 K/uL  ?  Lymphocytes Relative 29 %  ? Lymphs Abs 2.0 0.7 - 4.0 K/uL  ? Monocytes Relative 11 %  ? Monocytes Absolute 0.7 0.1 - 1.0 K/uL  ? Eosinophils Relative 1 %  ? Eosinophils Absolute 0.1 0.0 - 0.5 K/uL  ? Basophils Relative 0 %  ? Basophils Absolute  0.0 0.0 - 0.1 K/uL  ? Immature Granulocytes 0 %  ? Abs Immature Granulocytes 0.02 0.00 - 0.07 K/uL  ?  Comment: Performed at [facility], [address], [place], [place] [address]  ?CBC with Differential/Platelet     Status: Abnormal  ? Collection Time: 03/15/22  7:04 AM  ?Result Value Ref Range  ? WBC 6.7 4.0 - 10.5 K/uL  ? RBC 2.80 (L) 3.87 - 5.11 MIL/uL  ? Hemoglobin 8.9 (L) 12.0 - 15.0 g/dL  ? HCT 25.7 (L) 36.0 - 46.0 %  ? MCV 91.8 80.0 - 100.0 fL  ? MCH 31.8 26.0 - 34.0 pg  ? MCHC 34.6 30.0 - 36.0 g/dL  ? RDW 11.8 11.5 - 15.5 %  ? Platelets 171 150 - 400 K/uL  ? nRBC 0.0 0.0 - 0.2 %  ? Neutrophils Relative % 72 %  ? Neutro Abs 4.8 1.7 - 7.7 K/uL  ? Lymphocytes Relative 19 %  ? Lymphs Abs 1.3 0.7 - 4.0 K/uL  ? Monocytes Relative 8 %  ? Monocytes Absolute 0.5 0.1 - 1.0 K/uL  ? Eosinophils Relative 1 %  ? Eosinophils Absolute 0.1 0.0 - 0.5 K/uL  ? Basophils Relative 0 %  ? Basophils Absolute 0.0 0.0 - 0.1 K/uL  ? Immature Granulocytes 0 %  ? Abs Immature Granulocytes 0.03 0.00 - 0.07 K/uL  ?  Comment: Performed at [facility] Lab, [address] [place], [place] [address]  ?  ? ?Treatments: IV hydration and antibiotics: ceftriaxone and Bactrim ? ?Discharge Exam: ?Blood pressure (!) 108/55, pulse 79, temperature 98.3 ?F (36.8 ?C), temperature source Oral, resp. rate 16, height 5\' 1"  (1.549 m), weight 58.4 kg, SpO2 100 %. ?General appearance: alert, cooperative, and no distress ?Back: symmetric, no curvature. ROM normal. No CVA tenderness. ? ?Disposition: Discharge disposition: 01-Home or Self Care ? ? ? ? ? ? ? ?Allergies as of 03/15/2022   ? ?   Reactions  ? Latex Rash  ? ?  ? ?  ?Medication List  ?  ? ?STOP taking these medications   ? ?cephALEXin 500 MG capsule ?Commonly known as: KEFLEX ?  ?ibuprofen 200 MG tablet ?Commonly known as: ADVIL ?  ?ibuprofen 400 MG tablet ?Commonly known as: ADVIL ?  ?methocarbamol 500 MG tablet ?Commonly known as: ROBAXIN ?  ? ?  ? ?TAKE these medications   ? ?ferrous  sulfate 325 (65 FE) MG tablet ?Take 1 tablet (325 mg total) by mouth daily with breakfast. ?  ?prenatal multivitamin Tabs tablet ?Take 1 tablet by mouth daily at 12 noon. ?  ?sulfamethoxazole-trimethoprim 800-16

## 2023-02-13 ENCOUNTER — Observation Stay (HOSPITAL_COMMUNITY)
Admission: EM | Admit: 2023-02-13 | Discharge: 2023-02-14 | Disposition: A | Discharge status: Home / Self Care | Payer: Medicaid Other | Attending: Surgery | Admitting: Surgery

## 2023-02-13 ENCOUNTER — Encounter (HOSPITAL_COMMUNITY): Payer: Self-pay

## 2023-02-13 ENCOUNTER — Emergency Department (HOSPITAL_COMMUNITY): Payer: Medicaid Other

## 2023-02-13 ENCOUNTER — Other Ambulatory Visit: Payer: Self-pay

## 2023-02-13 DIAGNOSIS — K3580 Unspecified acute appendicitis: Secondary | ICD-10-CM | POA: Diagnosis not present

## 2023-02-13 DIAGNOSIS — R1031 Right lower quadrant pain: Secondary | ICD-10-CM | POA: Diagnosis present

## 2023-02-13 DIAGNOSIS — Z7722 Contact with and (suspected) exposure to environmental tobacco smoke (acute) (chronic): Secondary | ICD-10-CM | POA: Insufficient documentation

## 2023-02-13 DIAGNOSIS — Z91040 Latex allergy status: Secondary | ICD-10-CM | POA: Insufficient documentation

## 2023-02-13 HISTORY — DX: Renal tubulo-interstitial disease, unspecified: N15.9

## 2023-02-13 HISTORY — DX: Urinary tract infection, site not specified: N39.0

## 2023-02-13 LAB — CBC
HCT: 39.6 % (ref 36.0–46.0)
Hemoglobin: 13.1 g/dL (ref 12.0–15.0)
MCH: 30.1 pg (ref 26.0–34.0)
MCHC: 33.1 g/dL (ref 30.0–36.0)
MCV: 91 fL (ref 80.0–100.0)
Platelets: 269 10*3/uL (ref 150–400)
RBC: 4.35 MIL/uL (ref 3.87–5.11)
RDW: 12.3 % (ref 11.5–15.5)
WBC: 8.6 10*3/uL (ref 4.0–10.5)
nRBC: 0 % (ref 0.0–0.2)

## 2023-02-13 LAB — HEPATIC FUNCTION PANEL
ALT: 13 U/L (ref 0–44)
AST: 15 U/L (ref 15–41)
Albumin: 4.5 g/dL (ref 3.5–5.0)
Alkaline Phosphatase: 57 U/L (ref 38–126)
Bilirubin, Direct: 0.1 mg/dL (ref 0.0–0.2)
Indirect Bilirubin: 0.9 mg/dL (ref 0.3–0.9)
Total Bilirubin: 1 mg/dL (ref 0.3–1.2)
Total Protein: 7.5 g/dL (ref 6.5–8.1)

## 2023-02-13 LAB — POC URINE PREG, ED: Preg Test, Ur: NEGATIVE

## 2023-02-13 LAB — BASIC METABOLIC PANEL
Anion gap: 7 (ref 5–15)
BUN: 8 mg/dL (ref 6–20)
CO2: 25 mmol/L (ref 22–32)
Calcium: 9.5 mg/dL (ref 8.9–10.3)
Chloride: 107 mmol/L (ref 98–111)
Creatinine, Ser: 0.65 mg/dL (ref 0.44–1.00)
GFR, Estimated: >60 mL/min (ref >60)
Glucose, Bld: 92 mg/dL (ref 70–99)
Potassium: 3.6 mmol/L (ref 3.5–5.1)
Sodium: 139 mmol/L (ref 135–145)

## 2023-02-13 LAB — URINALYSIS, ROUTINE W REFLEX MICROSCOPIC
Bilirubin Urine: NEGATIVE
Glucose, UA: NEGATIVE mg/dL
Hgb urine dipstick: NEGATIVE
Ketones, ur: 5 mg/dL — AB
Leukocytes,Ua: NEGATIVE
Nitrite: NEGATIVE
Protein, ur: NEGATIVE mg/dL
Specific Gravity, Urine: 1.015 (ref 1.005–1.030)
pH: 7 (ref 5.0–8.0)

## 2023-02-13 LAB — LIPASE, BLOOD: Lipase: 27 U/L (ref 11–51)

## 2023-02-13 MED ORDER — IOHEXOL 300 MG/ML  SOLN
100.0000 mL | Freq: Once | INTRAMUSCULAR | Status: AC | PRN
  Administered 2023-02-13: 80 mL via INTRAVENOUS

## 2023-02-13 MED ORDER — ONDANSETRON HCL 4 MG/2ML IJ SOLN: 4.0000 mg | Freq: Four times a day (QID) | INTRAMUSCULAR | Status: DC | PRN

## 2023-02-13 MED ORDER — ACETAMINOPHEN 650 MG RE SUPP: 650.0000 mg | Freq: Four times a day (QID) | RECTAL | Status: DC | PRN

## 2023-02-13 MED ORDER — MORPHINE SULFATE (PF) 2 MG/ML IV SOLN: 2.0000 mg | INTRAVENOUS | Status: DC | PRN

## 2023-02-13 MED ORDER — ONDANSETRON HCL 4 MG PO TABS: 4.0000 mg | ORAL_TABLET | Freq: Four times a day (QID) | ORAL | Status: DC | PRN

## 2023-02-13 MED ORDER — ACETAMINOPHEN 325 MG PO TABS: 650.0000 mg | ORAL_TABLET | Freq: Four times a day (QID) | ORAL | Status: DC | PRN

## 2023-02-13 MED ORDER — SODIUM CHLORIDE 0.9 % IV SOLN
2.0000 g | INTRAVENOUS | Status: DC
  Administered 2023-02-13: 2 g via INTRAVENOUS
  Filled 2023-02-13: qty 20

## 2023-02-13 MED ORDER — SODIUM CHLORIDE 0.9 % IV SOLN: INTRAVENOUS | Status: AC

## 2023-02-13 MED ORDER — METRONIDAZOLE 500 MG/100ML IV SOLN
500.0000 mg | Freq: Two times a day (BID) | INTRAVENOUS | Status: DC
  Administered 2023-02-13 – 2023-02-14 (×2): 500 mg via INTRAVENOUS
  Filled 2023-02-13 (×2): qty 100

## 2023-02-13 NOTE — ED Provider Notes (Signed)
[facility] Provider Note   CSN: [id number] Arrival date & time: 02/13/23  1525     History Chief Complaint  Patient presents with   Flank Pain    "gets UTI's often" per pt    Alexis Padilla is a 22 y.o. female reportedly otherwise healthy presents emerged department today for evaluation of right lower quadrant pain that started yesterday.  Patient reports that it starts in her right lower quadrant and goes into her right flank.  She is concerned that she may have pyelonephritis again given that she had this in pregnancy and this felt similar however she is not having any urinary symptoms.  She denies any dysuria, hematuria, vaginal discharge, nausea, vomiting, diarrhea, constipation, hematochezia, melena, chest pain, shortness of breath, fever or any recent cough or cold symptoms.  Reports that she did start her menstrual cycle 2 days ago which has been on time for her.  She reports it is slightly heavier than usual however she just had a C-section 6 months ago and this is only been her second menstrual cycle since.  She is not saturating a pad an hour.  She denies any trauma to the abdomen.  Denies any exacerbating or relieving factors.   Flank Pain Associated symptoms include abdominal pain. Pertinent negatives include no chest pain and no shortness of breath.       Home Medications Prior to Admission medications   Medication Sig Start Date End Date Taking? Authorizing Provider  amoxicillin (AMOXIL) 500 MG capsule Take 500 mg by mouth 3 (three) times daily. 12/05/22  Yes [provider]  chlorhexidine (PERIDEX) 0.12 % solution [id number]:[id number] By Mouth Twice Daily 12/05/22  Yes [provider]  dexamethasone (DECADRON) 4 MG tablet Take 4 mg by mouth 3 (three) times daily as needed. 12/05/22  Yes [provider]  ferrous sulfate 325 (65 FE) MG EC tablet Take 1 tablet by mouth daily with breakfast. 08/16/22 08/16/23 Yes  [provider]  HYDROcodone-acetaminophen (NORCO/VICODIN) 5-325 MG tablet Take 1 tablet by mouth every 6 (six) hours as needed. 12/05/22  Yes [provider]  acetaminophen (TYLENOL) 325 MG tablet Take 325 mg by mouth every 6 (six) hours as needed for moderate pain. Take 3 tablets every 6 hours as needed for moderate pain    [provider]  cyclobenzaprine (FLEXERIL) 5 MG tablet cyclobenzaprine 5 mg tablet  TAKE 1/2 TABLET BY MOUTH UP TO THREE TIMES DAILY AS NEEDED    [provider]  Docusate Sodium (DSS) 100 MG CAPS TAKE ONE CAPSULE BY MOUTH TWICE DAILY FOR 10 DAYS    [provider]  Doxylamine-Pyridoxine 10-10 MG TBEC doxylamine 10 mg-pyridoxine (vit B6) 10 mg tablet,delayed release  Take 2 tablets every day by oral route.    [provider]  Ferrous Sulfate (IRON PO) Take 1 tablet by mouth daily.    [provider]  ferrous sulfate 325 (65 FE) MG tablet Take 1 tablet (325 mg total) by mouth daily with breakfast. 03/15/22   Everlene Farrier, MD  ibuprofen (ADVIL) 600 MG tablet TAKE ONE TABLET BY MOUTH EVERY 6 HOURS FOR 10 DAYS    [provider]  oxyCODONE (OXY IR/ROXICODONE) 5 MG immediate release tablet TAKE ONE TABLET BY MOUTH EVERY 3 HOURS AS NEEDED FOR UP TO FIVE DAYS    [provider]  Prenatal Vit-Fe Fumarate-FA (PRENATAL MULTIVITAMIN) TABS tablet Take 1 tablet by mouth daily at 12 noon. 03/15/22  Everlene Farrier, MD  sulfamethoxazole-trimethoprim (BACTRIM DS) 800-160 MG tablet Take 1 tablet by mouth every 12 (twelve) hours. 03/15/22   Everlene Farrier, MD      Allergies    Latex    Review of Systems   Review of Systems  Constitutional:  Negative for chills and fever.  HENT:  Negative for congestion and rhinorrhea.   Respiratory:  Negative for shortness of breath.   Cardiovascular:  Negative for chest pain.  Gastrointestinal:  Positive for abdominal pain. Negative for constipation, diarrhea, nausea and  vomiting.  Genitourinary:  Positive for flank pain and vaginal bleeding (On menstrual cycle). Negative for dysuria, frequency, hematuria, urgency and vaginal discharge.    Physical Exam Updated Vital Signs BP 116/73 (BP Location: Right Arm)   Pulse 89   Temp 98.5 F (36.9 C) (Oral)   Resp 18   Ht 5\' 1"  (1.549 m)   Wt 61.2 kg   LMP 02/13/2023 (Exact Date)   SpO2 99%   BMI 25.51 kg/m  Physical Exam Vitals and nursing note reviewed.  Constitutional:      General: She is not in acute distress.    Appearance: She is not ill-appearing or toxic-appearing.  HENT:     Head: Normocephalic and atraumatic.     Mouth/Throat:     Mouth: Mucous membranes are moist.  Eyes:     General: No scleral icterus. Cardiovascular:     Rate and Rhythm: Normal rate.  Pulmonary:     Effort: Pulmonary effort is normal. No respiratory distress.     Breath sounds: Normal breath sounds.  Abdominal:     General: Bowel sounds are normal.     Palpations: Abdomen is soft.     Tenderness: There is abdominal tenderness. There is no right CVA tenderness, left CVA tenderness, guarding or rebound.     Comments: Mild right lower quadrant tenderness palpation.  Soft.  No guarding or rebound.  Faint C-section scar noted in the very low abdomen.  Well-healing.  Pink tissue.  No induration, fluctuance, or increase in warmth.  Skin:    General: Skin is warm and dry.     Capillary Refill: Capillary refill takes less than 2 seconds.  Neurological:     General: No focal deficit present.     Mental Status: She is alert. Mental status is at baseline.     ED Results / Procedures / Treatments   Labs (all labs ordered are listed, but only abnormal results are displayed) Labs Reviewed  URINALYSIS, ROUTINE W REFLEX MICROSCOPIC - Abnormal; Notable for the following components:      Result Value   Ketones, ur 5 (*)    All other components within normal limits  CBC  BASIC METABOLIC PANEL  LIPASE, BLOOD  HEPATIC  FUNCTION PANEL  HIV ANTIBODY (ROUTINE TESTING W REFLEX)  COMPREHENSIVE METABOLIC PANEL  CBC  MAGNESIUM  PHOSPHORUS  POC URINE PREG, ED    EKG None  Radiology CT ABDOMEN PELVIS W CONTRAST  Result Date: 02/13/2023 CLINICAL DATA:  Abdominal/flank pain, stone suspected. Right lower quadrant abdominal pain EXAM: CT ABDOMEN AND PELVIS WITH CONTRAST TECHNIQUE: Multidetector CT imaging of the abdomen and pelvis was performed using the standard protocol following bolus administration of intravenous contrast. RADIATION DOSE REDUCTION: This exam was performed according to the departmental dose-optimization program which includes automated exposure control, adjustment of the mA and/or kV according to patient size and/or use of iterative reconstruction technique. CONTRAST:  59mL OMNIPAQUE IOHEXOL 300 MG/ML  SOLN COMPARISON:  None Available. FINDINGS: Lower chest: No acute abnormality. Hepatobiliary: No focal liver abnormality is seen. No gallstones, gallbladder wall thickening, or biliary dilatation. Pancreas: Unremarkable. No pancreatic ductal dilatation or surrounding inflammatory changes. Spleen: Normal in size without focal abnormality. Adrenals/Urinary Tract: Adrenal glands are unremarkable. Kidneys are normal, without renal calculi, focal lesion, or hydronephrosis. Bladder is unremarkable. Stomach/Bowel: Stomach is within normal limits. Appendix is fluid-filled and dilated measuring up to 1.1 cm with mild adjacent fat stranding consistent with acute appendicitis. There is a 4 mm appendicolith. No periappendiceal fluid collection or abscess. Vascular/Lymphatic: No significant vascular findings are present. No enlarged abdominal or pelvic lymph nodes. Reproductive: Uterus is normal. Left adnexal cyst measuring at least 3.4 x 3.6 x 4.4 cm. Other: No abdominal wall hernia or abnormality. No abdominopelvic ascites. Musculoskeletal: No acute or significant osseous findings. IMPRESSION: 1. Acute appendicitis without  evidence of perforation or abscess. 2. Left adnexal cyst measuring up to 4.4 cm. 3. No evidence of nephrolithiasis or hydronephrosis. Findings of acute appendicitis were called by telephone at the time of interpretation on 02/13/2023 at 9:16 pm to provider Micalah Cabezas , who verbally acknowledged these results. Electronically Signed   By: Keane Police D.O.   On: 02/13/2023 21:18    Procedures Procedures   Medications Ordered in ED Medications  cefTRIAXone (ROCEPHIN) 2 g in sodium chloride 0.9 % 100 mL IVPB (0 g Intravenous Stopped 02/13/23 2236)  metroNIDAZOLE (FLAGYL) IVPB 500 mg (has no administration in time range)  0.9 %  sodium chloride infusion (has no administration in time range)  morphine (PF) 2 MG/ML injection 2 mg (has no administration in time range)  acetaminophen (TYLENOL) tablet 650 mg (has no administration in time range)    Or  acetaminophen (TYLENOL) suppository 650 mg (has no administration in time range)  ondansetron (ZOFRAN) tablet 4 mg (has no administration in time range)    Or  ondansetron (ZOFRAN) injection 4 mg (has no administration in time range)  iohexol (OMNIPAQUE) 300 MG/ML solution 100 mL (80 mLs Intravenous Contrast Given 02/13/23 2052)    ED Course/ Medical Decision Making/ A&P Clinical Course as of 02/13/23 2337  Wed Feb 13, 2023  2116 Radiology called - acute appendicitis. 1.1cm with surrounding inflammation.  [RR]    Clinical Course User Index [RR] Sherrell Puller, PA-C   {                           Medical Decision Making Amount and/or Complexity of Data Reviewed Labs: ordered. Radiology: ordered.  Risk Prescription drug management. Decision regarding hospitalization.   22 y.o. female presents to the ER today for evaluation of right lower quadrant abdominal pain. Differential diagnosis includes but is not limited to AAA, mesenteric ischemia, appendicitis, diverticulitis, DKA, gastroenteritis, nephrolithiasis, pancreatitis, constipation, UTI, bowel  obstruction, biliary disease, IBD, PUD, hepatitis, ectopic pregnancy, ovarian torsion, PID. Vital signs unremarkable. Physical exam as noted above.   Does have some tenderness to the right lower quadrant, no CVA tenderness.  Symptoms more for ovarian cyst or appendicitis and less likely renal stone.  Will order CT with contrast.  If unremarkable we will proceed onto ultrasound.  Labs ordered as well.  I independently reviewed and interpreted the patient's labs.  Lipase within normal limits.  BMP unremarkable.  Hepatic function panel unremarkable.  CBC unremarkable.  Pregnancy test is negative.  Urinalysis shows 5 ketones otherwise no signs of infection or dehydration.  CT abdomen shows 1. Acute appendicitis  without evidence of perforation or abscess. 2. Left adnexal cyst measuring up to 4.4 cm. 3. No evidence of nephrolithiasis or hydronephrosis.  Pain is likely from appendicitis. General surgery consulted.   I spoke with general surgery, Dr. Okey Dupre. Recommends medical admission, NPO after midnight with surgery sometime tomorrow.   Antibiotics ordered.    I discussed with the patient and parent at bedside results.  Discussed the need for admission for appendicitis.  Discussed remaining n.p.o. after midnight for food and fluids.  Patient agreeable to admission.  Admit to [facility] hospitalist.  Portions of this report may have been transcribed using voice recognition software. Every effort was made to ensure accuracy; however, inadvertent computerized transcription errors may be present.   Final Clinical Impression(s) / ED Diagnoses Final diagnoses:  Acute appendicitis, unspecified acute appendicitis type    Rx / DC Orders ED Discharge Orders     None         Sherrell Puller, PA-C 02/13/23 2342    Cristie Hem, MD 02/14/23 2136

## 2023-02-13 NOTE — Progress Notes (Signed)
[facility]  Called by ED provider regarding patient. She complains of a 1 day history of right flank pain. She underwent a CT abdomen and pelvis, which demonstrated acute appendicitis with appendicolith; no perforation or abscess.  She has normal blood work without leukocytosis.  Plan for admission to hospitalist tonight with plan to change admission to my service tomorrow morning.  NPO at midnight.  IV fluids.  Would start Rocephin/Flagyl.  Plan for robotic assisted laparoscopic appendectomy tomorrow.  Please call with any questions or concerns.  Graciella Freer, DO [facility] [address] [place], [place] [address] [phone] (office)

## 2023-02-13 NOTE — ED Notes (Signed)
Patient transported to CT 

## 2023-02-13 NOTE — H&P (Signed)
History and Physical    Patient: Alexis Padilla [id number] DOB: 2001-01-13 DOA: 02/13/2023 DOS: the patient was seen and examined on 02/13/2023 PCP: System, Provider Not In  Patient coming from: Home  Chief Complaint:  Chief Complaint  Patient presents with   Flank Pain    "gets UTI's often" per pt   HPI: Alexis Padilla is a 22 y.o. female with medical history significant of frequent UTI who presents to the emergency department due to right lower quadrant abdominal pain which started yesterday.  Pain was sharp, constant and was rated as 5-6/10 on pain scale.  There was no known aggravating/elevating factors.  Pain continued till today, but became intermittent.  She presents to the emergency department for further evaluation and management.  She denies fever, chills, chest pain, shortness of breath, nausea, vomiting, diarrhea.  ED Course:  In the emergency department, she was hemodynamically stable.  Workup in the ED showed normal CBC and BMP, pregnancy test was negative, urinalysis was normal. CT abdomen and pelvis with contrast showed acute appendicitis without evidence of perforation or abscess. Left adnexal cyst measuring up to 4.4 cm. No evidence of nephrolithiasis or hydronephrosis. General surgery (Dr. Okey Dupre) was consulted and recommended admitting patient by hospitalist with plan to change admission to surgical service in the morning.  Hospitalist was asked to admit patient for further evaluation and management.  Review of Systems: Review of systems as noted in the HPI. All other systems reviewed and are negative.   Past Medical History:  Diagnosis Date   Kidney infection    UTI (urinary tract infection)    Past Surgical History:  Procedure Laterality Date   CESAREAN SECTION     CYST EXCISION      Social History:  reports that she has never smoked. She has been exposed to tobacco smoke. She has never used smokeless tobacco. She reports current drug use. Drug:  Marijuana. She reports that she does not drink alcohol.   Allergies  Allergen Reactions   Latex Rash, Hives and Itching    History reviewed. No pertinent family history.   Prior to Admission medications   Medication Sig Start Date End Date Taking? Authorizing Provider  amoxicillin (AMOXIL) 500 MG capsule Take 500 mg by mouth 3 (three) times daily. 12/05/22  Yes [provider]  chlorhexidine (PERIDEX) 0.12 % solution SMARTSIG:15 By Mouth Twice Daily 12/05/22  Yes [provider]  dexamethasone (DECADRON) 4 MG tablet Take 4 mg by mouth 3 (three) times daily as needed. 12/05/22  Yes [provider]  ferrous sulfate 325 (65 FE) MG EC tablet Take 1 tablet by mouth daily with breakfast. 08/16/22 08/16/23 Yes [provider]  HYDROcodone-acetaminophen (NORCO/VICODIN) 5-325 MG tablet Take 1 tablet by mouth every 6 (six) hours as needed. 12/05/22  Yes [provider]  acetaminophen (TYLENOL) 325 MG tablet Take 325 mg by mouth every 6 (six) hours as needed for moderate pain. Take 3 tablets every 6 hours as needed for moderate pain    [provider]  cyclobenzaprine (FLEXERIL) 5 MG tablet cyclobenzaprine 5 mg tablet  TAKE 1/2 TABLET BY MOUTH UP TO THREE TIMES DAILY AS NEEDED    [provider]  Docusate Sodium (DSS) 100 MG CAPS TAKE ONE CAPSULE BY MOUTH TWICE DAILY FOR 10 DAYS    [provider]  Doxylamine-Pyridoxine 10-10 MG TBEC doxylamine 10 mg-pyridoxine (vit B6) 10 mg tablet,delayed release  Take 2 tablets every day by oral route.    [provider]  Ferrous Sulfate (IRON PO) Take 1 tablet by mouth daily.    [provider]  ferrous sulfate 325 (65 FE) MG tablet Take 1 tablet (325 mg total) by mouth daily with breakfast. 03/15/22   Everlene Farrier, MD  ibuprofen (ADVIL) 600 MG tablet TAKE ONE TABLET BY MOUTH EVERY 6 HOURS FOR 10 DAYS    [provider]  oxyCODONE (OXY IR/ROXICODONE) 5 MG immediate release  tablet TAKE ONE TABLET BY MOUTH EVERY 3 HOURS AS NEEDED FOR UP TO FIVE DAYS    [provider]  Prenatal Vit-Fe Fumarate-FA (PRENATAL MULTIVITAMIN) TABS tablet Take 1 tablet by mouth daily at 12 noon. 03/15/22   Everlene Farrier, MD  sulfamethoxazole-trimethoprim (BACTRIM DS) 800-160 MG tablet Take 1 tablet by mouth every 12 (twelve) hours. 03/15/22   Everlene Farrier, MD    Physical Exam: BP 116/73 (BP Location: Right Arm)   Pulse 89   Temp 98.5 F (36.9 C) (Oral)   Resp 18   Ht 5\' 1"  (1.549 m)   Wt 61.2 kg   LMP 02/13/2023 (Exact Date)   SpO2 99%   BMI 25.51 kg/m   General: 22 y.o. year-old female well developed well nourished in no acute distress.  Alert and oriented x3. HEENT: NCAT, EOMI Neck: Supple, trachea medial Cardiovascular: Regular rate and rhythm with no rubs or gallops.  No thyromegaly or JVD noted.  No lower extremity edema. 2/4 pulses in all 4 extremities. Respiratory: Clear to auscultation with no wheezes or rales. Good inspiratory effort. Abdomen: Soft, tender to palpation of RLQ.  Nondistended with normal bowel sounds x4 quadrants. Muskuloskeletal: No cyanosis, clubbing or edema noted bilaterally Neuro: CN II-XII intact, strength 5/5 x 4, sensation, reflexes intact Skin: No ulcerative lesions noted or rashes Psychiatry: Judgement and insight appear normal. Mood is appropriate for condition and setting          Labs on Admission:  Basic Metabolic Panel: Recent Labs  Lab 02/13/23 1616  NA 139  K 3.6  CL 107  CO2 25  GLUCOSE 92  BUN 8  CREATININE 0.65  CALCIUM 9.5   Liver Function Tests: No results for input(s): "AST", "ALT", "ALKPHOS", "BILITOT", "PROT", "ALBUMIN" in the last 168 hours. No results for input(s): "LIPASE", "AMYLASE" in the last 168 hours. No results for input(s): "AMMONIA" in the last 168 hours. CBC: Recent Labs  Lab 02/13/23 1616  WBC 8.6  HGB 13.1  HCT 39.6  MCV 91.0  PLT 269   Cardiac Enzymes: No results for input(s):  "CKTOTAL", "CKMB", "CKMBINDEX", "TROPONINI" in the last 168 hours.  BNP (last 3 results) No results for input(s): "BNP" in the last 8760 hours.  ProBNP (last 3 results) No results for input(s): "PROBNP" in the last 8760 hours.  CBG: No results for input(s): "GLUCAP" in the last 168 hours.  Radiological Exams on Admission: CT ABDOMEN PELVIS W CONTRAST  Result Date: 02/13/2023 CLINICAL DATA:  Abdominal/flank pain, stone suspected. Right lower quadrant abdominal pain EXAM: CT ABDOMEN AND PELVIS WITH CONTRAST TECHNIQUE: Multidetector CT imaging of the abdomen and pelvis was performed using the standard protocol following bolus administration of intravenous contrast. RADIATION DOSE REDUCTION: This exam was performed according to the departmental dose-optimization program which includes automated exposure control, adjustment of the mA and/or kV according to patient size and/or use of iterative reconstruction technique. CONTRAST:  60mL OMNIPAQUE IOHEXOL 300 MG/ML  SOLN COMPARISON:  None Available. FINDINGS: Lower chest: No acute abnormality. Hepatobiliary: No focal liver abnormality is seen. No  gallstones, gallbladder wall thickening, or biliary dilatation. Pancreas: Unremarkable. No pancreatic ductal dilatation or surrounding inflammatory changes. Spleen: Normal in size without focal abnormality. Adrenals/Urinary Tract: Adrenal glands are unremarkable. Kidneys are normal, without renal calculi, focal lesion, or hydronephrosis. Bladder is unremarkable. Stomach/Bowel: Stomach is within normal limits. Appendix is fluid-filled and dilated measuring up to 1.1 cm with mild adjacent fat stranding consistent with acute appendicitis. There is a 4 mm appendicolith. No periappendiceal fluid collection or abscess. Vascular/Lymphatic: No significant vascular findings are present. No enlarged abdominal or pelvic lymph nodes. Reproductive: Uterus is normal. Left adnexal cyst measuring at least 3.4 x 3.6 x 4.4 cm. Other: No  abdominal wall hernia or abnormality. No abdominopelvic ascites. Musculoskeletal: No acute or significant osseous findings. IMPRESSION: 1. Acute appendicitis without evidence of perforation or abscess. 2. Left adnexal cyst measuring up to 4.4 cm. 3. No evidence of nephrolithiasis or hydronephrosis. Findings of acute appendicitis were called by telephone at the time of interpretation on 02/13/2023 at 9:16 pm to provider RILEY RANSOM , who verbally acknowledged these results. Electronically Signed   By: Keane Police D.O.   On: 02/13/2023 21:18    EKG: I independently viewed the EKG done and my findings are as followed: EKG was not done in the ED  Assessment/Plan Present on Admission:  Acute appendicitis  Principal Problem:   Acute appendicitis  Acute appendicitis Patient will be admitted to [facility] Continue IV NS at 75 mLs/Hr Continue IV ceftriaxone and Flagyl per surgical recommendation Continue IV morphine 2 mg q.4h p.r.n. for moderate to severe pain Continue IV Zofran p.r.n. N.p.o. at midnight General surgery already consulted by ED PA and will follow-up on patient in the morning.  DVT prophylaxis: SCDs  Code Status: Full code  Consults: General surgery (by ED PA)  Family Communication: Mom at bedside (all questions answered to satisfaction)  Severity of Illness: The appropriate patient status for this patient is INPATIENT. Inpatient status is judged to be reasonable and necessary in order to provide the required intensity of service to ensure the patient's safety. The patient's presenting symptoms, physical exam findings, and initial radiographic and laboratory data in the context of their chronic comorbidities is felt to place them at high risk for further clinical deterioration. Furthermore, it is not anticipated that the patient will be medically stable for discharge from the hospital within 2 midnights of admission.   * I certify that at the point of admission it is my clinical  judgment that the patient will require inpatient hospital care spanning beyond 2 midnights from the point of admission due to high intensity of service, high risk for further deterioration and high frequency of surveillance required.*  Author: Bernadette Hoit, DO 02/13/2023 10:20 PM  For on call review [email or web address].

## 2023-02-13 NOTE — ED Notes (Signed)
Pt given urine specimen cup.

## 2023-02-13 NOTE — ED Triage Notes (Signed)
Pt to ED from home with c/o right sided flank pain that started yesterday, pt Denis any urinary sx, reports she gets UTI's often and it feels like she has UTI and/or kidney infection.

## 2023-02-14 ENCOUNTER — Inpatient Hospital Stay (HOSPITAL_COMMUNITY): Payer: Medicaid Other | Admitting: Certified Registered Nurse Anesthetist

## 2023-02-14 ENCOUNTER — Other Ambulatory Visit: Payer: Self-pay

## 2023-02-14 ENCOUNTER — Encounter (HOSPITAL_COMMUNITY)
Admission: EM | Disposition: A | Discharge status: Home / Self Care | Payer: Self-pay | Source: Home / Self Care | Attending: Emergency Medicine

## 2023-02-14 DIAGNOSIS — K3589 Other acute appendicitis without perforation or gangrene: Secondary | ICD-10-CM

## 2023-02-14 DIAGNOSIS — K3580 Unspecified acute appendicitis: Secondary | ICD-10-CM

## 2023-02-14 HISTORY — PX: XI ROBOTIC LAPAROSCOPIC ASSISTED APPENDECTOMY: SHX6877

## 2023-02-14 LAB — COMPREHENSIVE METABOLIC PANEL
ALT: 11 U/L (ref 0–44)
AST: 11 U/L — ABNORMAL LOW (ref 15–41)
Albumin: 3.9 g/dL (ref 3.5–5.0)
Alkaline Phosphatase: 51 U/L (ref 38–126)
Anion gap: 8 (ref 5–15)
BUN: 8 mg/dL (ref 6–20)
CO2: 23 mmol/L (ref 22–32)
Calcium: 8.9 mg/dL (ref 8.9–10.3)
Chloride: 107 mmol/L (ref 98–111)
Creatinine, Ser: 0.65 mg/dL (ref 0.44–1.00)
GFR, Estimated: >60 mL/min (ref >60)
Glucose, Bld: 90 mg/dL (ref 70–99)
Potassium: 3.5 mmol/L (ref 3.5–5.1)
Sodium: 138 mmol/L (ref 135–145)
Total Bilirubin: 0.8 mg/dL (ref 0.3–1.2)
Total Protein: 6.6 g/dL (ref 6.5–8.1)

## 2023-02-14 LAB — CBC
HCT: 39.3 % (ref 36.0–46.0)
Hemoglobin: 12.8 g/dL (ref 12.0–15.0)
MCH: 30 pg (ref 26.0–34.0)
MCHC: 32.6 g/dL (ref 30.0–36.0)
MCV: 92 fL (ref 80.0–100.0)
Platelets: 249 10*3/uL (ref 150–400)
RBC: 4.27 MIL/uL (ref 3.87–5.11)
RDW: 12.3 % (ref 11.5–15.5)
WBC: 6.5 10*3/uL (ref 4.0–10.5)
nRBC: 0 % (ref 0.0–0.2)

## 2023-02-14 LAB — HIV ANTIBODY (ROUTINE TESTING W REFLEX): HIV Screen 4th Generation wRfx: NONREACTIVE

## 2023-02-14 LAB — MAGNESIUM: Magnesium: 2.1 mg/dL (ref 1.7–2.4)

## 2023-02-14 LAB — PHOSPHORUS: Phosphorus: 3.8 mg/dL (ref 2.5–4.6)

## 2023-02-14 SURGERY — APPENDECTOMY, ROBOT-ASSISTED, LAPAROSCOPIC
Anesthesia: General | Site: Abdomen

## 2023-02-14 MED ORDER — DEXAMETHASONE SODIUM PHOSPHATE 10 MG/ML IJ SOLN
INTRAMUSCULAR | Status: DC | PRN
  Administered 2023-02-14: 10 mg via INTRAVENOUS

## 2023-02-14 MED ORDER — MIDAZOLAM HCL 5 MG/5ML IJ SOLN
INTRAMUSCULAR | Status: DC | PRN
  Administered 2023-02-14 (×2): 2 mg via INTRAVENOUS

## 2023-02-14 MED ORDER — ROCURONIUM BROMIDE 10 MG/ML (PF) SYRINGE
PREFILLED_SYRINGE | INTRAVENOUS | Status: AC
  Filled 2023-02-14: qty 20

## 2023-02-14 MED ORDER — FENTANYL CITRATE (PF) 100 MCG/2ML IJ SOLN
INTRAMUSCULAR | Status: DC | PRN
  Administered 2023-02-14 (×2): 50 ug via INTRAVENOUS
  Administered 2023-02-14: 25 ug via INTRAVENOUS
  Administered 2023-02-14: 100 ug via INTRAVENOUS
  Administered 2023-02-14: 25 ug via INTRAVENOUS

## 2023-02-14 MED ORDER — SUGAMMADEX SODIUM 200 MG/2ML IV SOLN
INTRAVENOUS | Status: DC | PRN
  Administered 2023-02-14: 200 mg via INTRAVENOUS

## 2023-02-14 MED ORDER — PROPOFOL 10 MG/ML IV BOLUS
INTRAVENOUS | Status: AC
  Filled 2023-02-14: qty 20

## 2023-02-14 MED ORDER — OXYCODONE HCL 5 MG PO TABS: 5.0000 mg | ORAL_TABLET | Freq: Four times a day (QID) | ORAL | 0 refills | Status: AC | PRN

## 2023-02-14 MED ORDER — CHLORHEXIDINE GLUCONATE 0.12 % MT SOLN: 15.0000 mL | Freq: Once | OROMUCOSAL | Status: DC

## 2023-02-14 MED ORDER — CHLORHEXIDINE GLUCONATE 0.12 % MT SOLN
OROMUCOSAL | Status: AC
  Administered 2023-02-14: 15 mL
  Filled 2023-02-14: qty 15

## 2023-02-14 MED ORDER — CHLORHEXIDINE GLUCONATE CLOTH 2 % EX PADS: 6.0000 | MEDICATED_PAD | Freq: Once | CUTANEOUS | Status: DC

## 2023-02-14 MED ORDER — FENTANYL CITRATE (PF) 100 MCG/2ML IJ SOLN
INTRAMUSCULAR | Status: AC
  Filled 2023-02-14: qty 2

## 2023-02-14 MED ORDER — ONDANSETRON HCL 4 MG/2ML IJ SOLN: 4.0000 mg | Freq: Once | INTRAMUSCULAR | Status: DC | PRN

## 2023-02-14 MED ORDER — OXYCODONE HCL 5 MG PO TABS: 5.0000 mg | ORAL_TABLET | Freq: Once | ORAL | Status: DC | PRN

## 2023-02-14 MED ORDER — FENTANYL CITRATE PF 50 MCG/ML IJ SOSY
25.0000 ug | PREFILLED_SYRINGE | INTRAMUSCULAR | Status: DC | PRN
  Administered 2023-02-14: 50 ug via INTRAVENOUS
  Filled 2023-02-14: qty 1

## 2023-02-14 MED ORDER — SUCCINYLCHOLINE CHLORIDE 200 MG/10ML IV SOSY
PREFILLED_SYRINGE | INTRAVENOUS | Status: DC | PRN
  Administered 2023-02-14: 100 mg via INTRAVENOUS

## 2023-02-14 MED ORDER — DEXAMETHASONE SODIUM PHOSPHATE 10 MG/ML IJ SOLN
INTRAMUSCULAR | Status: AC
  Filled 2023-02-14: qty 1

## 2023-02-14 MED ORDER — ONDANSETRON HCL 4 MG/2ML IJ SOLN
INTRAMUSCULAR | Status: AC
  Filled 2023-02-14: qty 2

## 2023-02-14 MED ORDER — PHENYLEPHRINE 80 MCG/ML (10ML) SYRINGE FOR IV PUSH (FOR BLOOD PRESSURE SUPPORT)
PREFILLED_SYRINGE | INTRAVENOUS | Status: AC
  Filled 2023-02-14: qty 10

## 2023-02-14 MED ORDER — ROCURONIUM BROMIDE 100 MG/10ML IV SOLN
INTRAVENOUS | Status: DC | PRN
  Administered 2023-02-14: 40 mg via INTRAVENOUS

## 2023-02-14 MED ORDER — LIDOCAINE HCL (PF) 2 % IJ SOLN
INTRAMUSCULAR | Status: AC
  Filled 2023-02-14: qty 15

## 2023-02-14 MED ORDER — BUPIVACAINE HCL (PF) 0.5 % IJ SOLN
INTRAMUSCULAR | Status: AC
  Filled 2023-02-14: qty 30

## 2023-02-14 MED ORDER — OXYCODONE HCL 5 MG/5ML PO SOLN: 5.0000 mg | Freq: Once | ORAL | Status: DC | PRN

## 2023-02-14 MED ORDER — OXYCODONE HCL 5 MG PO TABS: 5.0000 mg | ORAL_TABLET | Freq: Four times a day (QID) | ORAL | 0 refills | Status: DC | PRN

## 2023-02-14 MED ORDER — SCOPOLAMINE 1 MG/3DAYS TD PT72: 1.0000 | MEDICATED_PATCH | Freq: Once | TRANSDERMAL | Status: DC

## 2023-02-14 MED ORDER — LIDOCAINE 2% (20 MG/ML) 5 ML SYRINGE
INTRAMUSCULAR | Status: DC | PRN
  Administered 2023-02-14: 80 mg via INTRAVENOUS

## 2023-02-14 MED ORDER — STERILE WATER FOR IRRIGATION IR SOLN
Status: DC | PRN
  Administered 2023-02-14: 500 mL

## 2023-02-14 MED ORDER — ACETAMINOPHEN 500 MG PO TABS: 1000.0000 mg | ORAL_TABLET | Freq: Four times a day (QID) | ORAL | 0 refills | Status: DC

## 2023-02-14 MED ORDER — LACTATED RINGERS IV SOLN: INTRAVENOUS | Status: DC

## 2023-02-14 MED ORDER — PROPOFOL 10 MG/ML IV BOLUS
INTRAVENOUS | Status: DC | PRN
  Administered 2023-02-14: 160 mg via INTRAVENOUS
  Administered 2023-02-14: 40 mg via INTRAVENOUS

## 2023-02-14 MED ORDER — OXYCODONE HCL 5 MG PO TABS
5.0000 mg | ORAL_TABLET | Freq: Four times a day (QID) | ORAL | Status: DC | PRN
  Administered 2023-02-14: 5 mg via ORAL
  Filled 2023-02-14: qty 1

## 2023-02-14 MED ORDER — MIDAZOLAM HCL 2 MG/2ML IJ SOLN
INTRAMUSCULAR | Status: AC
  Filled 2023-02-14: qty 2

## 2023-02-14 MED ORDER — BUPIVACAINE HCL (PF) 0.5 % IJ SOLN
INTRAMUSCULAR | Status: DC | PRN
  Administered 2023-02-14: 30 mL

## 2023-02-14 MED ORDER — SUCCINYLCHOLINE CHLORIDE 200 MG/10ML IV SOSY
PREFILLED_SYRINGE | INTRAVENOUS | Status: AC
  Filled 2023-02-14: qty 20

## 2023-02-14 MED ORDER — ACETAMINOPHEN 500 MG PO TABS: 1000.0000 mg | ORAL_TABLET | Freq: Four times a day (QID) | ORAL | 0 refills | Status: AC

## 2023-02-14 MED ORDER — DEXMEDETOMIDINE HCL IN NACL 80 MCG/20ML IV SOLN
INTRAVENOUS | Status: DC | PRN
  Administered 2023-02-14 (×3): 8 ug via BUCCAL
  Administered 2023-02-14: 12 ug via BUCCAL

## 2023-02-14 MED ORDER — LACTATED RINGERS IV SOLN: INTRAVENOUS | Status: DC | PRN

## 2023-02-14 MED ORDER — ONDANSETRON HCL 4 MG/2ML IJ SOLN
INTRAMUSCULAR | Status: DC | PRN
  Administered 2023-02-14: 4 mg via INTRAVENOUS

## 2023-02-14 MED ORDER — ORAL CARE MOUTH RINSE: 15.0000 mL | Freq: Once | OROMUCOSAL | Status: DC

## 2023-02-14 SURGICAL SUPPLY — 57 items
APPLICATOR COTTON TIP 6 STRL (MISCELLANEOUS) IMPLANT
APPLICATOR COTTON TIP 6IN STRL (MISCELLANEOUS) ×1 IMPLANT
BLADE SURG 15 STRL LF DISP TIS (BLADE) IMPLANT
BLADE SURG 15 STRL SS (BLADE) ×1
CANNULA REDUC XI 12-8 STAPL (CANNULA) ×1
CANNULA REDUCER 12-8 DVNC XI (CANNULA) ×1 IMPLANT
CHLORAPREP W/TINT 26 (MISCELLANEOUS) ×1 IMPLANT
COVER MAYO STAND XLG (MISCELLANEOUS) ×1 IMPLANT
COVER TIP SHEARS 8 DVNC (MISCELLANEOUS) ×1 IMPLANT
COVER TIP SHEARS 8MM DA VINCI (MISCELLANEOUS) ×1
DEFOGGER SCOPE WARMER CLEARIFY (MISCELLANEOUS) ×1 IMPLANT
DERMABOND ADVANCED .7 DNX12 (GAUZE/BANDAGES/DRESSINGS) ×1 IMPLANT
DRAPE ARM DVNC X/XI (DISPOSABLE) ×3 IMPLANT
DRAPE COLUMN DVNC XI (DISPOSABLE) ×1 IMPLANT
DRAPE DA VINCI XI ARM (DISPOSABLE) ×3
DRAPE DA VINCI XI COLUMN (DISPOSABLE) ×1
GLOVE BIO SURGEON STRL SZ7 (GLOVE) IMPLANT
GLOVE BIOGEL PI IND STRL 6.5 (GLOVE) ×2 IMPLANT
GLOVE BIOGEL PI IND STRL 7.0 (GLOVE) IMPLANT
GLOVE SURG SS PI 6.5 STRL IVOR (GLOVE) ×2 IMPLANT
GOWN STRL REUS W/TWL LRG LVL3 (GOWN DISPOSABLE) ×3 IMPLANT
GRASPER SUT TROCAR 14GX15 (MISCELLANEOUS) ×1 IMPLANT
KIT PINK PAD W/HEAD ARE REST (MISCELLANEOUS) ×1 IMPLANT
KIT PINK PAD W/HEAD ARM REST (MISCELLANEOUS) ×1 IMPLANT
MANIFOLD NEPTUNE II (INSTRUMENTS) ×1 IMPLANT
NDL HYPO 21X1.5 SAFETY (NEEDLE) ×1 IMPLANT
NDL INSUFFLATION 14GA 120MM (NEEDLE) ×1 IMPLANT
NEEDLE HYPO 21X1.5 SAFETY (NEEDLE) ×1 IMPLANT
NEEDLE INSUFFLATION 14GA 120MM (NEEDLE) ×1 IMPLANT
OBTURATOR OPTICAL STANDARD 8MM (TROCAR) ×1
OBTURATOR OPTICAL STND 8 DVNC (TROCAR) ×1
OBTURATOR OPTICALSTD 8 DVNC (TROCAR) ×1 IMPLANT
PACK LAP CHOLE LZT030E (CUSTOM PROCEDURE TRAY) ×1 IMPLANT
PENCIL HANDSWITCHING (ELECTRODE) ×1 IMPLANT
RELOAD STAPLE 45 2.5 WHT DVNC (STAPLE) IMPLANT
RELOAD STAPLER 2.5X45 WHT DVNC (STAPLE) ×1 IMPLANT
SEAL CANN UNIV 5-8 DVNC XI (MISCELLANEOUS) ×3 IMPLANT
SEAL XI 5MM-8MM UNIVERSAL (MISCELLANEOUS) ×3
SEALER VESSEL DA VINCI XI (MISCELLANEOUS) ×1
SEALER VESSEL EXT DVNC XI (MISCELLANEOUS) ×1 IMPLANT
SET TUBE SMOKE EVAC HIGH FLOW (TUBING) ×1 IMPLANT
STAPLER 45 DA VINCI SURE FORM (STAPLE) ×1
STAPLER 45 SUREFORM DVNC (STAPLE) ×1 IMPLANT
STAPLER CANNULA SEAL DVNC XI (STAPLE) ×1 IMPLANT
STAPLER CANNULA SEAL XI (STAPLE) ×1
STAPLER RELOAD 2.5X45 WHITE (STAPLE) ×1
STAPLER RELOAD 2.5X45 WHT DVNC (STAPLE) ×1
SUT MNCRL AB 4-0 PS2 18 (SUTURE) ×1 IMPLANT
SUT VICRYL 0 AB UR-6 (SUTURE) ×1 IMPLANT
SYR 30ML LL (SYRINGE) ×1 IMPLANT
SYS BAG RETRIEVAL 10MM (BASKET) ×1
SYSTEM BAG RETRIEVAL 10MM (BASKET) ×1 IMPLANT
TAPE TRANSPORE STRL 2 31045 (GAUZE/BANDAGES/DRESSINGS) ×1 IMPLANT
TRAY FOL W/BAG SLVR 16FR STRL (SET/KITS/TRAYS/PACK) IMPLANT
TRAY FOLEY W/BAG SLVR 16FR LF (SET/KITS/TRAYS/PACK) ×1
TUBE CONNECTING 12X1/4 (SUCTIONS) IMPLANT
WATER STERILE IRR 500ML POUR (IV SOLUTION) ×1 IMPLANT

## 2023-02-14 NOTE — Progress Notes (Signed)
[facility]  Spoke with the patient's mother and the patient's room on the floor.  I explained that she tolerated the procedure without difficulty.  She has dissolvable stitches under the skin with overlying skin glue.  This will flake off in 10 to 14 days.  She will return to the floor, if she is able to tolerate a diet and her pain is controlled, she will be discharged home.  I discharged her home with a prescription for narcotic pain medication that they should take as needed for pain.  I also want her taking scheduled Tylenol.  If they take the narcotic pain medication, they should take a stool softener as well.  The patient will follow-up with me in 2 weeks for phone follow-up.  All questions were answered to her expressed satisfaction.  Plan: -Patient to return to bed on floor -Regular diet -PRN pain control and antiemetics -Plan for discharge home this afternoon once patient is able to tolerate diet and pain is controlled with oral medications  Graciella Freer, DO [facility] [address] [place], [place] [address] [phone] (office)

## 2023-02-14 NOTE — Discharge Instructions (Signed)
Ambulatory Surgery Discharge Instructions  General Anesthesia or Sedation Do not drive or operate heavy machinery for 24 hours.  Do not consume alcohol, tranquilizers, sleeping medications, or any non-prescribed medications for 24 hours. Do not make important decisions or sign any important papers in the next 24 hours. You should have someone with you tonight at home.  Activity  You are advised to go directly home from the hospital.  Restrict your activities and rest for a day.  Resume light activity tomorrow. No heavy lifting over 10 lbs or strenuous exercise.  Fluids and Diet Begin with clear liquids, bouillon, dry toast, soda crackers.  If not nauseated, you may go to a regular diet when you desire.  Greasy and spicy foods are not advised.  Medications  If you have not had a bowel movement in 24 hours, take 2 tablespoons over the counter Milk of mag.             You May resume your blood thinners tomorrow (Aspirin, coumadin, or other).  You are being discharged with prescriptions for Opioid/Narcotic Medications: There are some specific considerations for these medications that you should know. Opioid Meds have risks & benefits. Addiction to these meds is always a concern with prolonged use Take medication only as directed Do not drive while taking narcotic pain medication Do not crush tablets or capsules Do not use a different container than medication was dispensed in Lock the container of medication in a cool, dry place out of reach of children and pets. Opioid medication can cause addiction Do not share with anyone else (this is a felony) Do not store medications for future use. Dispose of them properly.     Disposal:  Find a [place] household drug take back site near you.  If you can't get to a drug take back site, use the recipe below as a last resort to dispose of expired, unused or unwanted drugs. Disposal  (Do not dispose chemotherapy drugs this way, talk to your  prescribing doctor instead.) Step 1: Mix drugs (do not crush) with dirt, kitty litter, or used coffee grounds and add a small amount of water to dissolve any solid medications. Step 2: Seal drugs in plastic bag. Step 3: Place plastic bag in trash. Step 4: Take prescription container and scratch out personal information, then recycle or throw away.  Operative Site  You have a liquid bandage over your incisions, this will begin to flake off in about a week. Ok to shower tomorrow. Keep wound clean and dry. No baths or swimming. No lifting more than 10 pounds.  Contact Information: If you have questions or concerns, please call our office, [phone], Monday- Thursday 8AM-5PM and Friday 8AM-12Noon.  If it is after hours or on the weekend, please call [facility] Main Number, [phone], and ask to speak to the surgeon on call for Dr. Deneisha Dade at [place].   SPECIFIC COMPLICATIONS TO WATCH FOR: Inability to urinate Fever over 101? F by mouth Nausea and vomiting lasting longer than 24 hours. Pain not relieved by medication ordered Swelling around the operative site Increased redness, warmth, hardness, around operative area Numbness, tingling, or cold fingers or toes Blood -soaked dressing, (small amounts of oozing may be normal) Increasing and progressive drainage from surgical area or exam site  

## 2023-02-14 NOTE — Op Note (Addendum)
[facility] Operative Note  Preoperative diagnosis: Acute appendicitis  Postoperative diagnosis: Same  Procedure: Robotic assisted laparoscopic appendectomy, lysis of adhesions  Anesthesia: General   Surgeon: Theophilus Kinds, DO  Wound Classification: Contaminated  Specimen: Appendix  Complications: None  Estimated Blood Loss: 5 cc  Indications: Patient is a 22 y.o. female  presented with 1 day history of right sided abdominal pain.  CT abdomen and pelvis demonstrated acute appendicitis without abscess or perforation. The risk of surgery were explained to the patient including but not limited to bleeding, infection, finding a rupture, injury to other organs, needing to do an open procedure.   All risks and benefits of performing this procedure were discussed with the patient including pain, infection, bleeding, damage to the surrounding structures, and need for more procedures or surgery. The patient voiced understanding of the procedure, all questions were sought and answered, and consent was obtained.  FIndings: 1.  Irritated appendix  2. No peri-appendiceal abscess or phlegmon 3. Normal anatomy 4. Appendiceal artery ligated and divided with vessel sealer 5. Adequate hemostasis.  Upon entering the abdomen (organ space), I encountered infection of the appendix .   Description of procedure: The patient was placed on the operating table in the supine position, left arm tucked. General anesthesia was induced. A time-out was completed verifying correct patient, procedure, site, positioning, and implant(s) and/or special equipment prior to beginning this procedure. The abdomen was prepped and draped in the usual sterile fashion.   In Palmer's point an incision was made and abdomen was entered via Hassasn technique.  12 mm trocar was placed directly into the abdomen, and gas insufflation was initiated until the abdominal pressure was measured at 15 mmHg.  No  injuries were noted. Two additional incision was made 8 cm apart each side along the left side of the abdominal wall from the initial incision.  Two 8 mm ports were placed in the left side of the abdomen, both were placed under direct visualization.  No injuries from trocar placements were noted. The table flexed and was placed in the Trendelenburg position with the right side elevated.  Xi robotic platform was then brought to the operative field and docked. A vessel sealer was placed through the 12 mm port and a forced bipolar through the lower 8 mm port.   There was some omental adhesions to the anterior abdominal wall.  These were taken down with vessel sealer. Attention was then turned to the appendix. Upon entering the abdomen (organ space), I encountered infection of the appendix. An appendix that appeared dilated and inflamed was identified and elevated.  Infection was present within the abdominal cavity due to appendicitis. The vessel sealer was used to ligate the mesoappendix.  A white load linear cutting stapler was placed through the 12 mm port, and then used to divide and staple the base of the appendix.  No bleeding from the staple lines noted.  The appendix was placed in an endoscopic retrieval bag and removed through the 12 mm port.   The appendiceal stump and mesoappendix staple line examined again and hemostasis noted. The 12 mm trocar site was closed with PMI using 0 vicryl under direct vision. Remaining trocars were removed. No bleeding was noted.The abdomen was allowed to collapse. All skin incisions then closed with subcuticular sutures Monocryl 4-0.  Wounds then dressed with dermabond.  The patient tolerated the procedure well, awakened from anesthesia and was taken to the postanesthesia care unit in satisfactory condition.  Sponge count  and instrument count correct at the end of the procedure.  Theophilus Kinds, DO [facility] [address] [place], [place] [address] [phone] (office)

## 2023-02-14 NOTE — Progress Notes (Signed)
Handoff received from [place].  NPO after midnight, consent signed, CHG wipe bath given

## 2023-02-14 NOTE — TOC Progression Note (Signed)
Transition of Care [facility]) - Progression Note    Patient Details  Name: [name] MRN: [id number] Date of Birth: 15-Jun-2001  Transition of Care [facility]) CM/SW Contact  Salome Arnt,  Phone Number: 02/14/2023, 10:03 AM  Clinical Narrative:  Transition of Care (TOC) Screening Note   Patient Details  Name: [name] Date of Birth: April 28, 2001   Transition of Care (TOC) CM/SW Contact:    Salome Arnt, LCSW Phone Number: 02/14/2023, 10:03 AM    Transition of Care Department [facility]) has reviewed patient and no TOC needs have been identified at this time. We will continue to monitor patient advancement through interdisciplinary progression rounds. If new patient transition needs arise, please place a TOC consult.             Expected Discharge Plan and Services                                               Social Determinants of Health (SDOH) Interventions SDOH Screenings   Food Insecurity: No Food Insecurity (02/14/2023)  Housing: Low Risk  (02/14/2023)  Transportation Needs: No Transportation Needs (02/14/2023)  Utilities: Not At Risk (02/14/2023)  Tobacco Use: Medium Risk (02/13/2023)    Readmission Risk Interventions     No data to display

## 2023-02-14 NOTE — Discharge Summary (Signed)
Physician Discharge Summary  Patient ID: Alexis Padilla MRN: [id number] DOB/AGE: 2001-05-26 22 y.o.  Admit date: 02/13/2023 Discharge date: 02/14/2023  Admission Diagnoses:  Discharge Diagnoses:  Principal Problem:   Acute appendicitis   Discharged Condition: stable  Hospital Course: Patient is a 22 year old female who was admitted with acute appendicitis.  She had a 1 day history of right-sided abdominal pain, and presented to the emergency department, at which time CT abdomen and pelvis was performed and demonstrated acute appendicitis without abscess or perforation.  She is status post robotic assisted laparoscopic appendectomy on 4/4.  Postoperatively, her pain was well-controlled, and she was able to tolerate a diet without nausea and vomiting.  She is stable for discharge at this time.  She will be discharged home with a prescription for oxycodone and Tylenol.  She will have a phone follow-up with me in 2 weeks.  Consults: None  Significant Diagnostic Studies: radiology: CT scan: Acute appendicitis  Treatments: IV hydration, antibiotics: ceftriaxone and metronidazole, analgesia: acetaminophen and oxycodone, and surgery: Robotic assisted laparoscopic appendectomy on 4/4  Discharge Exam: Blood pressure 103/62, pulse 67, temperature 98.2 F (36.8 C), temperature source Oral, resp. rate 20, height 5\' 1"  (1.549 m), weight 135 lb (61.2 kg), last menstrual period 02/13/2023, SpO2 99 %, unknown if currently breastfeeding. General appearance: alert, cooperative, and no distress GI: Abdomen soft, nondistended, no percussion tenderness, mild incisional tenderness to palpation; no rigidity, guarding, rebound tenderness; incisions C/D/I with Dermabond in place  Disposition: Discharge disposition: 01-Home or Self Care       Discharge Instructions     Call MD for:  persistant nausea and vomiting   Complete by: As directed    Call MD for:  redness, tenderness, or signs of infection (pain,  swelling, redness, odor or green/yellow discharge around incision site)   Complete by: As directed    Call MD for:  severe uncontrolled pain   Complete by: As directed    Call MD for:  temperature >100.4   Complete by: As directed    Diet - low sodium heart healthy   Complete by: As directed    Increase activity slowly   Complete by: As directed       Allergies as of 02/14/2023       Reactions   Latex Rash, Hives, Itching        Follow-up Information     Luanna Weesner A, DO. Call.   Specialty: General Surgery Why: I will call you for phone follow up in 2 weeks Contact information: [address] [facility] [address] [phone]                 Signed: [place] 02/14/2023, 9:02 PM

## 2023-02-14 NOTE — Consult Note (Signed)
[facility] Consult  Reason for Consult: Acute appendicitis Referring Physician: Sherrell Puller, PA-C  Chief Complaint   Flank Pain     HPI: Alexis Padilla is a 22 y.o. female who presents with a 1 day history of right-sided abdominal and right flank pain.  She initially thought the pain was just from an upset stomach, but when it persisted for over a day, she decided to present to the emergency room for evaluation.  She denies any fever, chills, nausea, vomiting, and diarrhea.  In the ED, she underwent a CT abdomen pelvis which demonstrated acute appendicitis without perforation or abscess.  She had no leukocytosis and vital signs were stable.  She has a past medical history significant for UTIs and kidney infections.  Her surgical history significant for C-section.  She denies use of blood thinning medications.  This morning on evaluation, the patient denies any abdominal pain.  She has no complaints.  Past Medical History:  Diagnosis Date   Kidney infection    UTI (urinary tract infection)     Past Surgical History:  Procedure Laterality Date   CESAREAN SECTION     CYST EXCISION      History reviewed. No pertinent family history.  Social History   Tobacco Use   Smoking status: Never    Passive exposure: Yes   Smokeless tobacco: Never  Vaping Use   Vaping Use: Every day  Substance Use Topics   Alcohol use: No   Drug use: Yes    Types: Marijuana    Medications: I have reviewed the patient's current medications.  Allergies  Allergen Reactions   Latex Rash, Hives and Itching     ROS:  Pertinent items are noted in HPI.  Blood pressure 114/80, pulse 74, temperature 98.7 F (37.1 C), temperature source Oral, resp. rate (!) 21, height 5\' 1"  (1.549 m), weight 61.2 kg, last menstrual period 02/13/2023, SpO2 91 %, unknown if currently breastfeeding. Physical Exam Vitals reviewed.  Constitutional:      Appearance: Normal appearance.  HENT:      Head: Normocephalic and atraumatic.  Eyes:     Extraocular Movements: Extraocular movements intact.     Pupils: Pupils are equal, round, and reactive to light.  Cardiovascular:     Rate and Rhythm: Normal rate.  Pulmonary:     Effort: Pulmonary effort is normal.  Abdominal:     Comments: Abdomen is soft, nondistended, no percussion tenderness, nontender to palpation; no rigidity, guarding, rebound tenderness; negative McBurney sign  Musculoskeletal:        General: Normal range of motion.     Cervical back: Normal range of motion.  Skin:    General: Skin is warm and dry.  Neurological:     General: No focal deficit present.     Mental Status: She is alert and oriented to person, place, and time.  Psychiatric:        Mood and Affect: Mood normal.        Behavior: Behavior normal.     Results: Results for orders placed or performed during the hospital encounter of 02/13/23 (from the past 48 hour(s))  CBC     Status: None   Collection Time: 02/13/23  4:16 PM  Result Value Ref Range   WBC 8.6 4.0 - 10.5 K/uL   RBC 4.35 3.87 - 5.11 MIL/uL   Hemoglobin 13.1 12.0 - 15.0 g/dL   HCT 39.6 36.0 - 46.0 %   MCV 91.0 80.0 - 100.0 fL  MCH 30.1 26.0 - 34.0 pg   MCHC 33.1 30.0 - 36.0 g/dL   RDW 12.3 11.5 - 15.5 %   Platelets 269 150 - 400 K/uL   nRBC 0.0 0.0 - 0.2 %    Comment: Performed at [facility], [address] [place], [place] [id number]  Basic metabolic panel     Status: None   Collection Time: 02/13/23  4:16 PM  Result Value Ref Range   Sodium 139 135 - 145 mmol/L   Potassium 3.6 3.5 - 5.1 mmol/L   Chloride 107 98 - 111 mmol/L   CO2 25 22 - 32 mmol/L   Glucose, Bld 92 70 - 99 mg/dL    Comment: Glucose reference range applies only to samples taken after fasting for at least 8 hours.   BUN 8 6 - 20 mg/dL   Creatinine, Ser 0.65 0.44 - 1.00 mg/dL   Calcium 9.5 8.9 - 10.3 mg/dL   GFR, Estimated >60 >60 mL/min    Comment: (NOTE) Calculated using the CKD-EPI Creatinine  Equation (2021)    Anion gap 7 5 - 15    Comment: Performed at [facility], [address], [place], [place] [address]  Lipase, blood     Status: None   Collection Time: 02/13/23  4:16 PM  Result Value Ref Range   Lipase 27 11 - 51 U/L    Comment: Performed at [facility], [address], [place], [place] [address]  Hepatic function panel     Status: None   Collection Time: 02/13/23  4:16 PM  Result Value Ref Range   Total Protein 7.5 6.5 - 8.1 g/dL   Albumin 4.5 3.5 - 5.0 g/dL   AST 15 15 - 41 U/L   ALT 13 0 - 44 U/L   Alkaline Phosphatase 57 38 - 126 U/L   Total Bilirubin 1.0 0.3 - 1.2 mg/dL   Bilirubin, Direct 0.1 0.0 - 0.2 mg/dL   Indirect Bilirubin 0.9 0.3 - 0.9 mg/dL    Comment: Performed at [facility], [address], [place], [place] [address]  Urinalysis, Routine w reflex microscopic -Urine, Clean Catch     Status: Abnormal   Collection Time: 02/13/23  5:40 PM  Result Value Ref Range   Color, Urine YELLOW YELLOW   APPearance CLEAR CLEAR   Specific Gravity, Urine 1.015 1.005 - 1.030   pH 7.0 5.0 - 8.0   Glucose, UA NEGATIVE NEGATIVE mg/dL   Hgb urine dipstick NEGATIVE NEGATIVE   Bilirubin Urine NEGATIVE NEGATIVE   Ketones, ur 5 (A) NEGATIVE mg/dL   Protein, ur NEGATIVE NEGATIVE mg/dL   Nitrite NEGATIVE NEGATIVE   Leukocytes,Ua NEGATIVE NEGATIVE    Comment: Performed at [facility], [address], [place], [place] [address]  POC urine preg, ED     Status: None   Collection Time: 02/13/23  5:42 PM  Result Value Ref Range   Preg Test, Ur NEGATIVE NEGATIVE    Comment:        THE SENSITIVITY OF THIS METHODOLOGY IS >24 mIU/mL   HIV Antibody (routine testing w rflx)     Status: None   Collection Time: 02/14/23  4:30 AM  Result Value Ref Range   HIV Screen 4th Generation wRfx Non Reactive Non Reactive    Comment: Performed at [facility], [place] [address], [place],  [address]  Comprehensive metabolic panel     Status: Abnormal   Collection Time:  02/14/23  4:30 AM  Result Value Ref  Range   Sodium 138 135 - 145 mmol/L   Potassium 3.5 3.5 - 5.1 mmol/L   Chloride 107 98 - 111 mmol/L   CO2 23 22 - 32 mmol/L   Glucose, Bld 90 70 - 99 mg/dL    Comment: Glucose reference range applies only to samples taken after fasting for at least 8 hours.   BUN 8 6 - 20 mg/dL   Creatinine, Ser 0.65 0.44 - 1.00 mg/dL   Calcium 8.9 8.9 - 10.3 mg/dL   Total Protein 6.6 6.5 - 8.1 g/dL   Albumin 3.9 3.5 - 5.0 g/dL   AST 11 (L) 15 - 41 U/L   ALT 11 0 - 44 U/L   Alkaline Phosphatase 51 38 - 126 U/L   Total Bilirubin 0.8 0.3 - 1.2 mg/dL   GFR, Estimated >60 >60 mL/min    Comment: (NOTE) Calculated using the CKD-EPI Creatinine Equation (2021)    Anion gap 8 5 - 15    Comment: Performed at [facility], [address], [place], [place] [address]  CBC     Status: None   Collection Time: 02/14/23  4:30 AM  Result Value Ref Range   WBC 6.5 4.0 - 10.5 K/uL   RBC 4.27 3.87 - 5.11 MIL/uL   Hemoglobin 12.8 12.0 - 15.0 g/dL   HCT 39.3 36.0 - 46.0 %   MCV 92.0 80.0 - 100.0 fL   MCH 30.0 26.0 - 34.0 pg   MCHC 32.6 30.0 - 36.0 g/dL   RDW 12.3 11.5 - 15.5 %   Platelets 249 150 - 400 K/uL   nRBC 0.0 0.0 - 0.2 %    Comment: Performed at [facility], [address] [place], [place] [address]  Magnesium     Status: None   Collection Time: 02/14/23  4:30 AM  Result Value Ref Range   Magnesium 2.1 1.7 - 2.4 mg/dL    Comment: Performed at [facility], [address] [place], [place] [address]  Phosphorus     Status: None   Collection Time: 02/14/23  4:30 AM  Result Value Ref Range   Phosphorus 3.8 2.5 - 4.6 mg/dL    Comment: Performed at [facility], [address] [place], [place] [address]    CT ABDOMEN PELVIS W CONTRAST  Result Date: 02/13/2023 CLINICAL DATA:  Abdominal/flank pain, stone suspected. Right lower quadrant abdominal pain EXAM: CT ABDOMEN AND PELVIS WITH CONTRAST TECHNIQUE: Multidetector CT imaging of the abdomen and pelvis was performed  using the standard protocol following bolus administration of intravenous contrast. RADIATION DOSE REDUCTION: This exam was performed according to the departmental dose-optimization program which includes automated exposure control, adjustment of the mA and/or kV according to patient size and/or use of iterative reconstruction technique. CONTRAST:  55mL OMNIPAQUE IOHEXOL 300 MG/ML  SOLN COMPARISON:  None Available. FINDINGS: Lower chest: No acute abnormality. Hepatobiliary: No focal liver abnormality is seen. No gallstones, gallbladder wall thickening, or biliary dilatation. Pancreas: Unremarkable. No pancreatic ductal dilatation or surrounding inflammatory changes. Spleen: Normal in size without focal abnormality. Adrenals/Urinary Tract: Adrenal glands are unremarkable. Kidneys are normal, without renal calculi, focal lesion, or hydronephrosis. Bladder is unremarkable. Stomach/Bowel: Stomach is within normal limits. Appendix is fluid-filled and dilated measuring up to 1.1 cm with mild adjacent fat stranding consistent with acute appendicitis. There is a 4 mm appendicolith. No periappendiceal fluid collection or abscess. Vascular/Lymphatic: No significant vascular findings are present. No enlarged abdominal or pelvic lymph nodes. Reproductive: Uterus is normal. Left adnexal cyst  measuring at least 3.4 x 3.6 x 4.4 cm. Other: No abdominal wall hernia or abnormality. No abdominopelvic ascites. Musculoskeletal: No acute or significant osseous findings. IMPRESSION: 1. Acute appendicitis without evidence of perforation or abscess. 2. Left adnexal cyst measuring up to 4.4 cm. 3. No evidence of nephrolithiasis or hydronephrosis. Findings of acute appendicitis were called by telephone at the time of interpretation on 02/13/2023 at 9:16 pm to provider RILEY RANSOM , who verbally acknowledged these results. Electronically Signed   By: Keane Police D.O.   On: 02/13/2023 21:18     Assessment & Plan:  OUMY EUDY is a 22  y.o. female who was admitted with acute appendicitis.  Imaging and blood work evaluated by myself  -I will switch the patient's admission over to myself -I discussed the pathophysiology of appendicitis, and our options. -Discussed the risk of laparoscopic appendectomy and the option of antibiotics alone. Discussed that in [place] and some trials in the [place], antibiotics are used for simple appendicitis. Discussed that research shows a 40% failure rate for antibiotics alone.  Discussed risk of surgery including but not limited to bleeding, infection, injury to other organs, normal appendix, and after this discussion the patient has decided to proceed with robotic assisted laparoscopic appendectomy. -NPO -Rocephin and Flagyl -As needed pain medications and antiemetics -IV fluids -Discussed possible discharge home after surgery with the patient.  Further recommendations to follow surgery  All questions were answered to the satisfaction of the patient.  -- Graciella Freer, DO [facility] [address] [place], [place] [address] [phone] (office)

## 2023-02-14 NOTE — ED Notes (Signed)
Called report to Richardson Landry, Therapist, sports.

## 2023-02-14 NOTE — Anesthesia Procedure Notes (Signed)
Procedure Name: Intubation Date/Time: 02/14/2023 12:33 PM  Performed by: Hewitt Blade, CRNAPre-anesthesia Checklist: Patient identified, Emergency Drugs available, Suction available and Patient being monitored Patient Re-evaluated:Patient Re-evaluated prior to induction Oxygen Delivery Method: Circle system utilized Preoxygenation: Pre-oxygenation with 100% oxygen Induction Type: IV induction and Rapid sequence Laryngoscope Size: Mac and 3 Grade View: Grade I Tube type: Oral Tube size: 7.0 mm Number of attempts: 1 Airway Equipment and Method: Stylet Placement Confirmation: ETT inserted through vocal cords under direct vision, positive ETCO2 and breath sounds checked- equal and bilateral Secured at: 20 cm Tube secured with: Tape Dental Injury: Teeth and Oropharynx as per pre-operative assessment

## 2023-02-14 NOTE — Anesthesia Preprocedure Evaluation (Signed)
Anesthesia Evaluation  Patient identified by MRN, date of birth, ID band Patient awake    Reviewed: Allergy & Precautions, H&P , NPO status , Patient's Chart, lab work & pertinent test results, reviewed documented beta blocker date and time   Airway Mallampati: II  TM Distance: >3 FB Neck ROM: full    Dental no notable dental hx.    Pulmonary neg pulmonary ROS, Patient abstained from smoking.   Pulmonary exam normal breath sounds clear to auscultation       Cardiovascular Exercise Tolerance: Good negative cardio ROS  Rhythm:regular Rate:Normal     Neuro/Psych negative neurological ROS  negative psych ROS   GI/Hepatic negative GI ROS, Neg liver ROS,,,  Endo/Other  negative endocrine ROS    Renal/GU negative Renal ROS  negative genitourinary   Musculoskeletal   Abdominal   Peds  Hematology negative hematology ROS (+)   Anesthesia Other Findings   Reproductive/Obstetrics negative OB ROS                             Anesthesia Physical Anesthesia Plan  ASA: 1 and emergent  Anesthesia Plan: General and General ETT   Post-op Pain Management:    Induction:   PONV Risk Score and Plan: Ondansetron  Airway Management Planned:   Additional Equipment:   Intra-op Plan:   Post-operative Plan:   Informed Consent: I have reviewed the patients History and Physical, chart, labs and discussed the procedure including the risks, benefits and alternatives for the proposed anesthesia with the patient or authorized representative who has indicated his/her understanding and acceptance.     Dental Advisory Given  Plan Discussed with: CRNA  Anesthesia Plan Comments:        Anesthesia Quick Evaluation

## 2023-02-14 NOTE — Transfer of Care (Signed)
Immediate Anesthesia Transfer of Care Note  Patient: Alexis Padilla  Procedure(s) Performed: XI ROBOTIC LAPAROSCOPIC ASSISTED APPENDECTOMY (Abdomen)  Patient Location: PACU  Anesthesia Type:General  Level of Consciousness: awake  Airway & Oxygen Therapy: Patient Spontanous Breathing and Patient connected to nasal cannula oxygen  Post-op Assessment: Report given to RN and Post -op Vital signs reviewed and stable  Post vital signs: Reviewed and stable  Last Vitals:  Vitals Value Taken Time  BP 108/64 02/14/23 1417  Temp    Pulse 79 02/14/23 1418  Resp 14 02/14/23 1418  SpO2 100 % 02/14/23 1418  Vitals shown include unvalidated device data.  Last Pain:  Vitals:   02/14/23 1004  TempSrc: Oral  PainSc: 0-No pain      Patients Stated Pain Goal: 4 ([id number])  Complications: No notable events documented.

## 2023-02-15 NOTE — Anesthesia Postprocedure Evaluation (Signed)
Anesthesia Post Note  Patient: Alexis Padilla  Procedure(s) Performed: XI ROBOTIC LAPAROSCOPIC ASSISTED APPENDECTOMY (Abdomen)  Patient location during evaluation: Phase II Anesthesia Type: General Level of consciousness: awake Pain management: pain level controlled Vital Signs Assessment: post-procedure vital signs reviewed and stable Respiratory status: spontaneous breathing and respiratory function stable Cardiovascular status: blood pressure returned to baseline and stable Postop Assessment: no headache and no apparent nausea or vomiting Anesthetic complications: no Comments: Late entry   No notable events documented.   Last Vitals:  Vitals:   02/14/23 1445 02/14/23 1608  BP: 113/74 103/62  Pulse: 87 67  Resp: (!) 24 20  Temp:  36.8 C  SpO2: 95% 99%    Last Pain:  Vitals:   02/14/23 1608  TempSrc: Oral  PainSc:                  Windell Norfolk

## 2023-02-18 LAB — SURGICAL PATHOLOGY

## 2023-02-22 ENCOUNTER — Encounter (HOSPITAL_COMMUNITY): Payer: Self-pay | Admitting: Surgery

## 2023-03-06 ENCOUNTER — Ambulatory Visit (INDEPENDENT_AMBULATORY_CARE_PROVIDER_SITE_OTHER): Payer: Medicaid Other | Admitting: Surgery

## 2023-03-06 DIAGNOSIS — Z09 Encounter for follow-up examination after completed treatment for conditions other than malignant neoplasm: Secondary | ICD-10-CM

## 2023-03-06 NOTE — Progress Notes (Signed)
[facility]  I am calling the patient for post operative evaluation. This is not a billable encounter as it is under the global charges for the surgery.  The patient had a robotic assisted laparoscopic appendectomy on 4/4. The patient reports that she is doing well.  She is tolerating a diet, having good pain control, and having regular Bms.  The incisions are healing well, and have some Dermabond still in place.  I explained that she can use antibiotic ointment prior to showering to get the remaining Dermabond off.  She feels a bump under the skin at her superiormost incision.  I explained that this is likely the knot from her stitch.  If the area pops to the skin, she should call our office, as we can excise the suture if needed.  Also explained that she should wait until 4 weeks after surgery before she is submerging her incisions. The patient has no concerns.   Pathology: A. APPENDIX, APPENDECTOMY:       Acute appendicitis with transmural inflammation and serositis.   Will see the patient PRN.   Theophilus Kinds, DO [facility] [address] [place], [place] [address] [phone] (office)

## 2023-09-26 ENCOUNTER — Encounter (HOSPITAL_COMMUNITY): Payer: Self-pay | Admitting: *Deleted

## 2023-09-26 ENCOUNTER — Emergency Department (HOSPITAL_COMMUNITY)
Admission: EM | Admit: 2023-09-26 | Discharge: 2023-09-26 | Disposition: A | Discharge status: Home / Self Care | Payer: Medicaid Other | Attending: Emergency Medicine | Admitting: Emergency Medicine

## 2023-09-26 ENCOUNTER — Other Ambulatory Visit: Payer: Self-pay

## 2023-09-26 DIAGNOSIS — Z91040 Latex allergy status: Secondary | ICD-10-CM | POA: Diagnosis not present

## 2023-09-26 DIAGNOSIS — R102 Pelvic and perineal pain: Secondary | ICD-10-CM | POA: Insufficient documentation

## 2023-09-26 LAB — URINALYSIS, W/ REFLEX TO CULTURE (INFECTION SUSPECTED)
Bilirubin Urine: NEGATIVE
Glucose, UA: NEGATIVE mg/dL
Hgb urine dipstick: NEGATIVE
Ketones, ur: 5 mg/dL — AB
Leukocytes,Ua: NEGATIVE
Nitrite: NEGATIVE
Protein, ur: 30 mg/dL — AB
Specific Gravity, Urine: 1.021 (ref 1.005–1.030)
pH: 5 (ref 5.0–8.0)

## 2023-09-26 LAB — HCG, QUANTITATIVE, PREGNANCY: hCG, Beta Chain, Quant, S: <1 m[IU]/mL (ref <5)

## 2023-09-26 NOTE — ED Triage Notes (Addendum)
Pt lower abd pain since yesterday, worse this morning.  Denies any vaginal bleeding or vaginal discharge that is not normal per pt. LMP last month.  Pt with implant to left arm. Pt with three positive pregnancy tests at home.

## 2023-09-26 NOTE — ED Provider Notes (Signed)
[place] EMERGENCY DEPARTMENT AT [facility] Provider Note   CSN: [id number] Arrival date & time: 09/26/23  1107     History  Chief Complaint  Patient presents with   Abdominal Pain    Alexis Padilla is a 22 y.o. female who presents to ED concerned for pelvic discomfort and intermittent nausea x1 day. Patient stating she took 3 UPT at home which were positive. Pelvic pain is 4/10 severity and patient is having difficulties explaining if it is cramping vs stabbing vs other pain. LMP 4 weeks ago. Last BM yesterday.  Denies fever, chest pain, dyspnea, cough, vomiting, diarrhea, dysuria, hematuria, hematochezia. Denies dyspareunia, vaginal discharge.     Abdominal Pain      Home Medications Prior to Admission medications   Medication Sig Start Date End Date Taking? Authorizing Provider  amoxicillin (AMOXIL) 500 MG capsule Take 500 mg by mouth 3 (three) times daily. 12/05/22   [provider]  chlorhexidine (PERIDEX) 0.12 % solution SMARTSIG:15 By Mouth Twice Daily 12/05/22   [provider]  cyclobenzaprine (FLEXERIL) 5 MG tablet cyclobenzaprine 5 mg tablet  TAKE 1/2 TABLET BY MOUTH UP TO THREE TIMES DAILY AS NEEDED    [provider]  dexamethasone (DECADRON) 4 MG tablet Take 4 mg by mouth 3 (three) times daily as needed. 12/05/22   [provider]  Docusate Sodium (DSS) 100 MG CAPS TAKE ONE CAPSULE BY MOUTH TWICE DAILY FOR 10 DAYS    [provider]  Doxylamine-Pyridoxine 10-10 MG TBEC doxylamine 10 mg-pyridoxine (vit B6) 10 mg tablet,delayed release  Take 2 tablets every day by oral route.    [provider]  Ferrous Sulfate (IRON PO) Take 1 tablet by mouth daily.    [provider]  ferrous sulfate 325 (65 FE) MG EC tablet Take 1 tablet by mouth daily with breakfast. 08/16/22 08/16/23  [provider]  ferrous sulfate 325 (65 FE) MG tablet Take 1 tablet (325 mg total) by mouth daily with breakfast.  03/15/22   Harold Hedge, MD  ibuprofen (ADVIL) 600 MG tablet TAKE ONE TABLET BY MOUTH EVERY 6 HOURS FOR 10 DAYS    [provider]  oxyCODONE (OXY IR/ROXICODONE) 5 MG immediate release tablet Take 1 tablet (5 mg total) by mouth every 6 (six) hours as needed for severe pain or moderate pain. 02/14/23   Pappayliou, Gustavus Messing, DO  Prenatal Vit-Fe Fumarate-FA (PRENATAL MULTIVITAMIN) TABS tablet Take 1 tablet by mouth daily at 12 noon. 03/15/22   Harold Hedge, MD  sulfamethoxazole-trimethoprim (BACTRIM DS) 800-160 MG tablet Take 1 tablet by mouth every 12 (twelve) hours. 03/15/22   Harold Hedge, MD      Allergies    Latex    Review of Systems   Review of Systems  Gastrointestinal:  Positive for abdominal pain.    Physical Exam Updated Vital Signs BP 120/74 (BP Location: Right Arm)   Pulse 99   Temp 97.8 F (36.6 C) (Oral)   Resp 16   Ht 5\' 1"  (1.549 m)   Wt 61.2 kg   LMP 08/13/2023 (Exact Date)   SpO2 100%   BMI 25.51 kg/m  Physical Exam Vitals and nursing note reviewed.  Constitutional:      General: She is not in acute distress.    Appearance: She is not ill-appearing or toxic-appearing.  HENT:     Head: Normocephalic and atraumatic.  Eyes:     General: No scleral icterus.       Right eye:  No discharge.        Left eye: No discharge.     Conjunctiva/sclera: Conjunctivae normal.  Cardiovascular:     Rate and Rhythm: Normal rate.  Pulmonary:     Effort: Pulmonary effort is normal.  Abdominal:     General: Abdomen is flat. Bowel sounds are normal.     Tenderness: There is no abdominal tenderness.  Skin:    General: Skin is warm and dry.  Neurological:     General: No focal deficit present.     Mental Status: She is alert. Mental status is at baseline.  Psychiatric:        Mood and Affect: Mood normal.        Behavior: Behavior normal.     ED Results / Procedures / Treatments   Labs (all labs ordered are listed, but only abnormal results are  displayed) Labs Reviewed  URINALYSIS, W/ REFLEX TO CULTURE (INFECTION SUSPECTED) - Abnormal; Notable for the following components:      Result Value   APPearance HAZY (*)    Ketones, ur 5 (*)    Protein, ur 30 (*)    Bacteria, UA RARE (*)    All other components within normal limits  HCG, QUANTITATIVE, PREGNANCY    EKG None  Radiology No results found.  Procedures Procedures    Medications Ordered in ED Medications - No data to display  ED Course/ Medical Decision Making/ A&P                                 Medical Decision Making Amount and/or Complexity of Data Reviewed Labs: ordered.    This patient presents to the ED for concern of abdominal pain, this involves an extensive number of treatment options, and is a complaint that carries with it a high risk of complications and morbidity.  The differential diagnosis includes gastroenteritis, colitis, small bowel obstruction, appendicitis, cholecystitis, pancreatitis, nephrolithiasis, UTI, pyleonephritis, ruptured ectopic pregnancy, PID, ovarian torsion.   Co morbidities that complicate the patient evaluation  none   Additional history obtained:  Ladona Ridgel NP, PCP   Lab Tests:  I Ordered, and personally interpreted labs.  The pertinent results include: UA: not concerning for infection Hcg: negative  Problem List / ED Course / Critical interventions / Medication management  Patient presents to ED for pelvic discomfort and intermittent nausea over the past day.  Infectious symptoms today.  Patient afebrile with stable vitals.  Physical exam unremarkable. Patient concerned stating that 3 home UPT were positive. Hcg negative. Offered patient further blood work to check kidney levels and WBC.  Patient declined.  Offered patient pelvic exam and swabs for STD.  Patient declined stating that she is not concerned for STD or PID. Patient stating that she can follow up with GYN. Offered patient ultrasound to check for  tubo-ovarian abscess vs ovarian cyst vs ovarian torsion.  Patient declined.  Patient stating that she would only like her urine tested today.  UA was not concerning for UTI.  Shared results with patient and recommended following up with PCP.  Patient stating that her symptoms may be due to an oncoming menstrual cycle.  Patient verbalized understanding of plan. I have reviewed the patients home medicines and have made adjustments as needed Patient was given return precautions. Patient stable for discharge at this time.  Patient verbalized understanding of plan.  Ddx: These are considered less likely due to history of present  illness and physical exam. -gastroenteritis: No vomiting in ED; no fever; tolerating PO intake -colitis: Denies diarrhea, patient afebrile  -small bowel obstruction: Last BM yesterday  -appendicitis: Negative McBurney point, rebound tenderness, psoas, obturator sign  -cholecystitis: Negative Murphy sign; liver enzymes within normal limits  -pancreatitis: No LUQ tenderness to palpation, lipase within normal limits  -nephrolithiasis: Denies flank pain and urinary complaints  -UTI/pyelonephritis: Denies urinary complaints  -ruptured ectopic pregnancy, PID, ovarian torsion: Pain is not severe, patient denies dyspareunia and vaginal discharge, hCG negative.   Social Determinants of Health:  none          Final Clinical Impression(s) / ED Diagnoses Final diagnoses:  Pelvic pain in female    Rx / DC Orders ED Discharge Orders     None         Dorthy Cooler, [place] 09/26/23 1331    Bethann Berkshire, MD 09/27/23 1742

## 2023-09-26 NOTE — Discharge Instructions (Addendum)
It was a pleasure caring for you today.  Urine was without concern for UTI.  Please follow-up with your gynecologist in the next week.  Seek emergency care if experiencing any new or worsening symptoms.

## 2023-12-20 ENCOUNTER — Other Ambulatory Visit: Payer: Self-pay

## 2023-12-20 ENCOUNTER — Encounter (HOSPITAL_COMMUNITY): Payer: Self-pay | Admitting: *Deleted

## 2023-12-20 ENCOUNTER — Emergency Department (HOSPITAL_COMMUNITY): Payer: PRIVATE HEALTH INSURANCE

## 2023-12-20 ENCOUNTER — Emergency Department (HOSPITAL_COMMUNITY)
Admission: EM | Admit: 2023-12-20 | Discharge: 2023-12-20 | Discharge status: Against Medical Advice | Payer: PRIVATE HEALTH INSURANCE | Attending: Emergency Medicine | Admitting: Emergency Medicine

## 2023-12-20 DIAGNOSIS — M25511 Pain in right shoulder: Secondary | ICD-10-CM | POA: Insufficient documentation

## 2023-12-20 DIAGNOSIS — Z5321 Procedure and treatment not carried out due to patient leaving prior to being seen by health care provider: Secondary | ICD-10-CM | POA: Diagnosis not present

## 2023-12-20 NOTE — ED Triage Notes (Signed)
 Pt with right shoulder pain off and on for a year, but recently pain is constant and worse. Denies any injury to shoulder.

## 2024-04-15 IMAGING — US US RENAL
1 series · 15 of 25 positions shown · non-contrast
Comparison: September 30, 2007

CLINICAL DATA: Eighteen weeks pregnant with right-sided back pain.

EXAM:
RENAL / URINARY TRACT ULTRASOUND COMPLETE

[Series 1: us renal · 37 acquisitions, 15 frames shown]
[im 1/37]
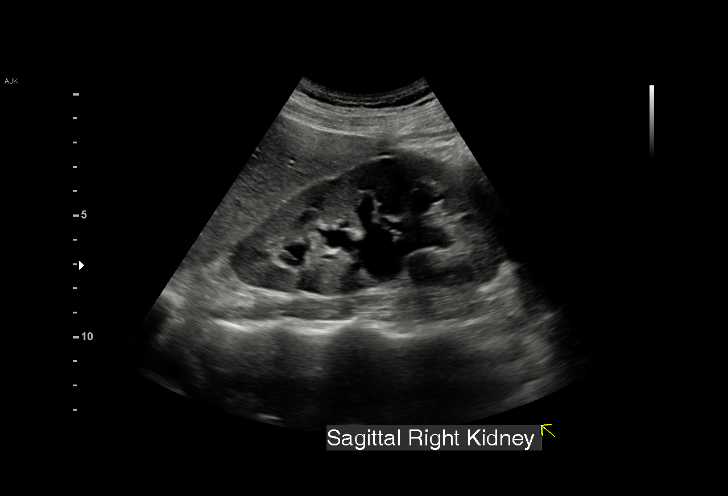
[im 4/37]
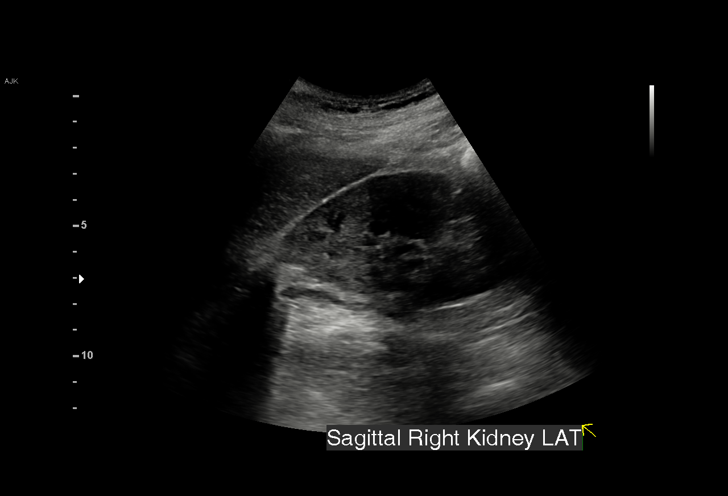
[im 7/37]
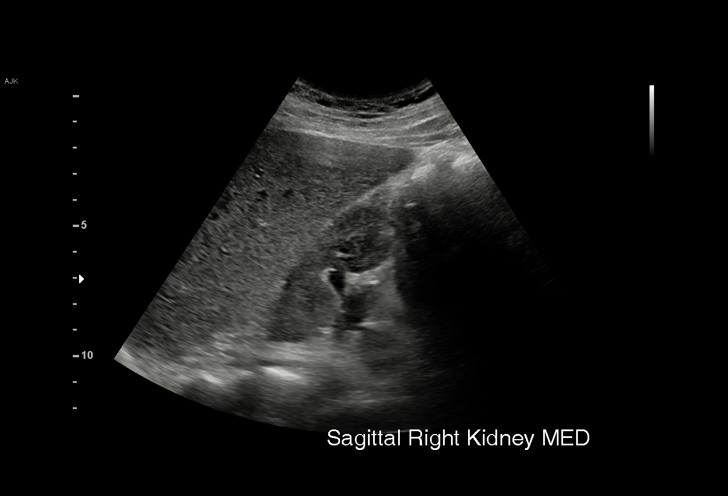
[im 8/37]
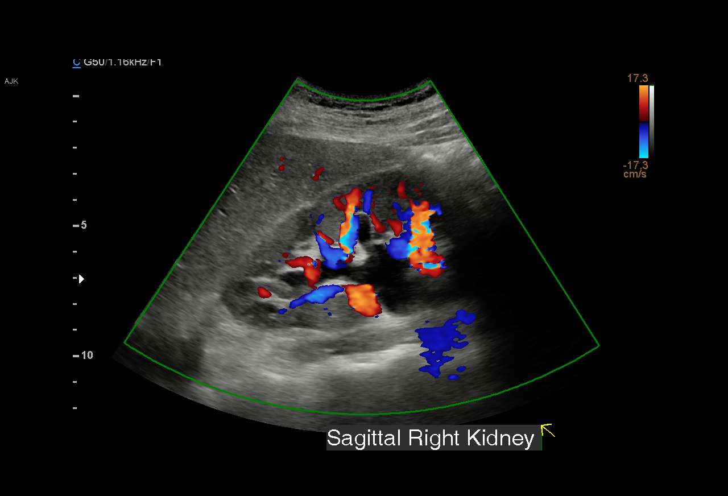
[im 11/37]
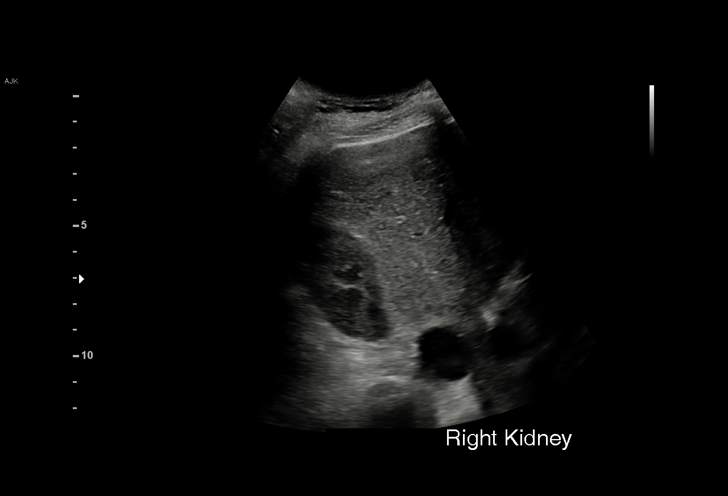
[im 14/37]
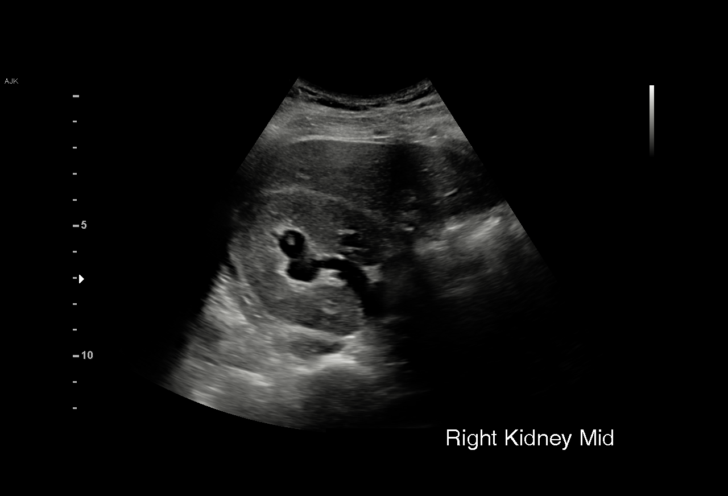
[im 16/37]
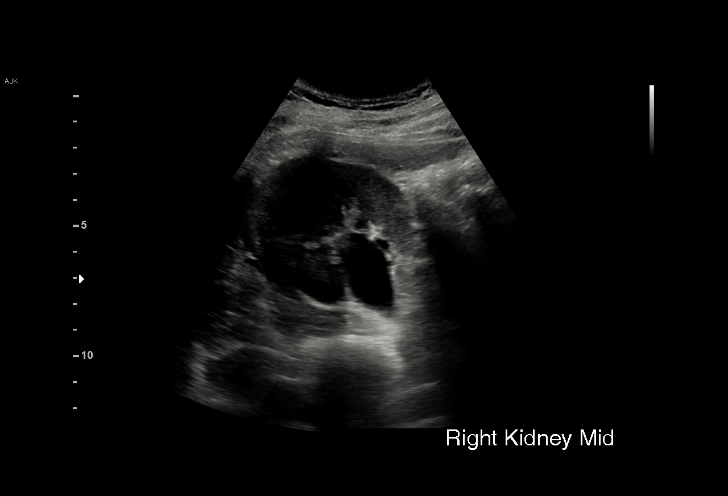
[im 19/37]
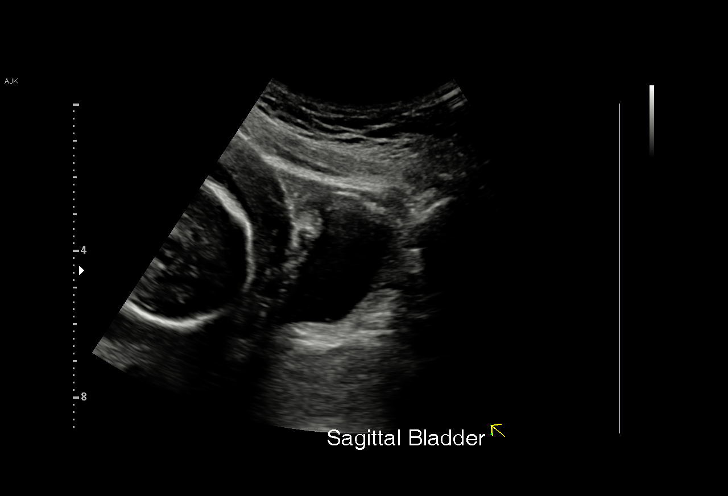
[im 22/37]
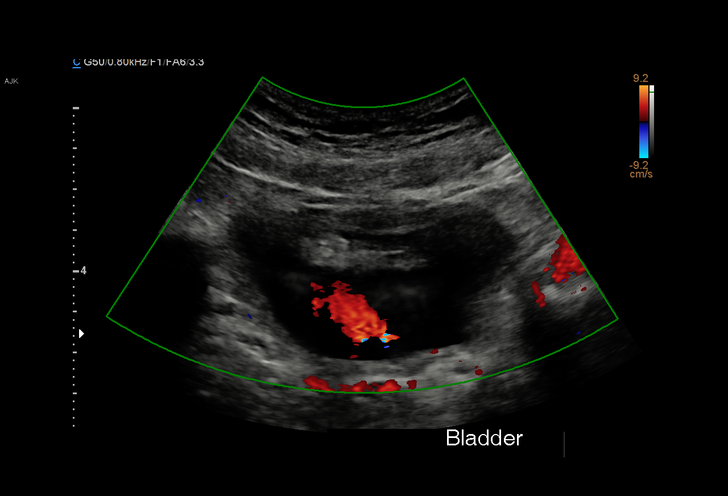
[im 23/37]
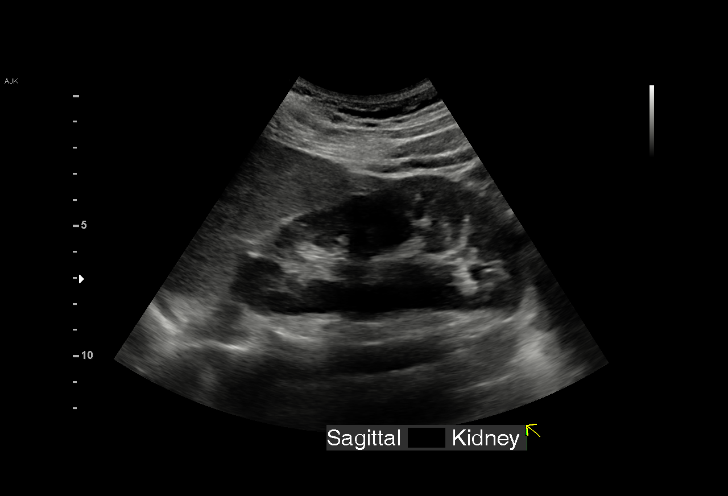
[im 26/37]
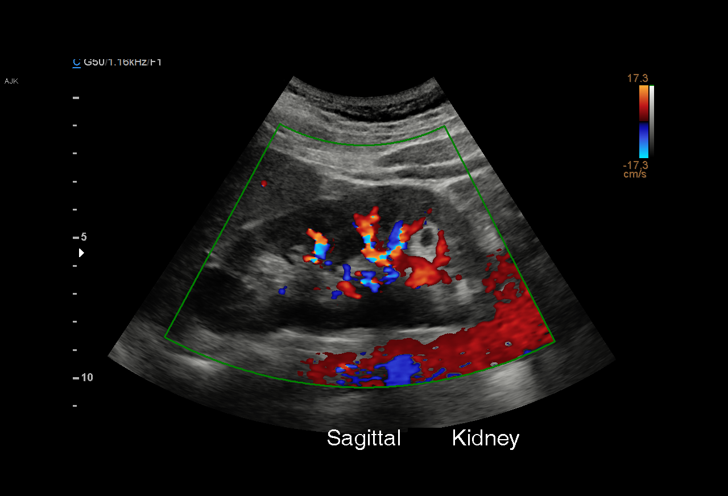
[im 29/37]
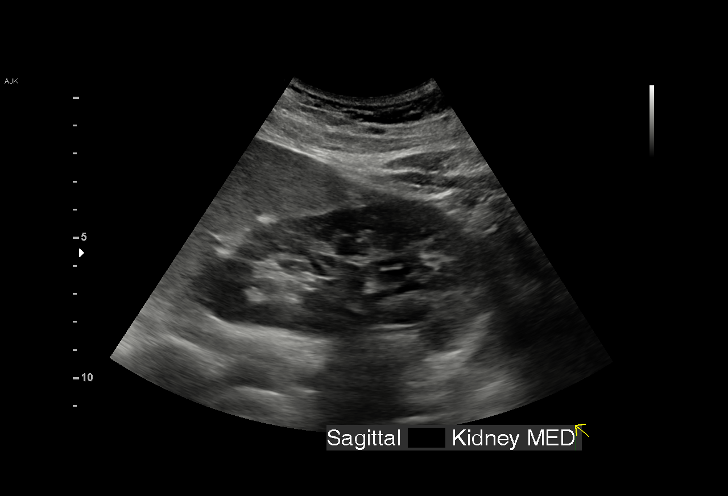
[im 31/37]
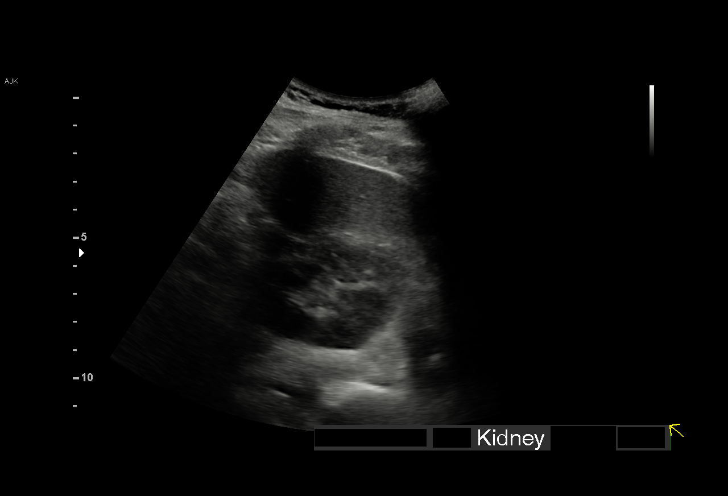
[im 34/37]
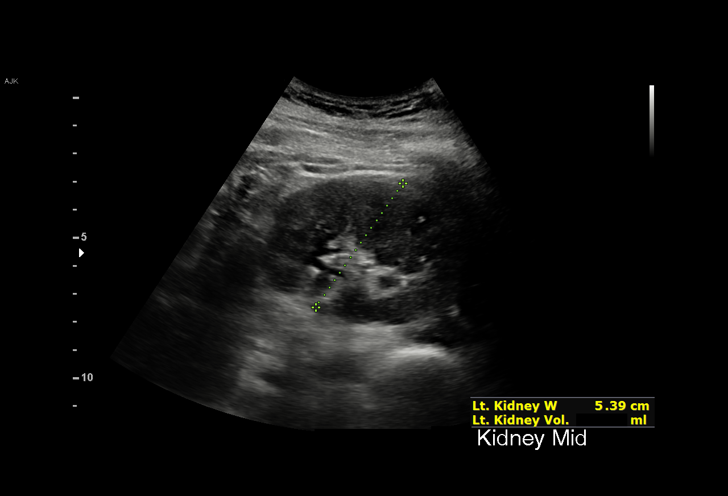
[im 37/37]
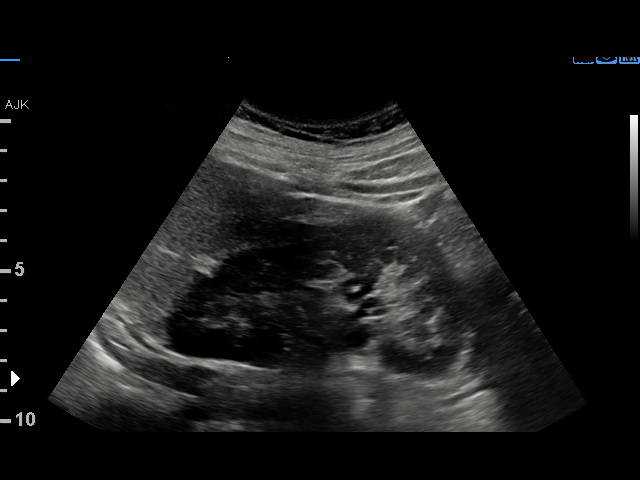

[15 of 25 positions shown; findings below may reference images not displayed]

FINDINGS: Right Kidney:

Renal measurements: 11.7 cm x 5.3 cm x 6.0 cm = volume: 196.5 mL.
Echogenicity within normal limits. No mass is visualized. There is
mild right-sided hydronephrosis.

Left Kidney:

Renal measurements: 11.5 cm x 5.2 cm x 5.4 cm = volume: 169.4 mL.
Echogenicity within normal limits. No mass or hydronephrosis
visualized.

Bladder:

Appears normal for degree of bladder distention.

Other:

None.
IMPRESSION: Mild right-sided hydronephrosis.

## 2024-07-29 ENCOUNTER — Encounter: Payer: Self-pay | Admitting: Gastroenterology

## 2024-09-21 ENCOUNTER — Ambulatory Visit: Payer: PRIVATE HEALTH INSURANCE | Admitting: Gastroenterology

## 2024-09-21 NOTE — Progress Notes (Deleted)
 Alexis Padilla [id number] 2001-06-21   Chief Complaint:  Referring Provider: Waddell Mitzie SAUNDERS, FNP Primary GI MD: [place]  HPI: Alexis Padilla is a 23 y.o. female with past medical history of UTI, pyelonephritis, appendectomy 2024 who presents today for a complaint of *** .    Referred by PCP for persistent postprandial upper abdominal pain and bloating.  Gastritis or PUD was suspected and she was started on a course of Carafate to help relieve burning.  Possibility of CT scan was discussed but opted to refer to GI first before ordering this study.    Discussed the use of AI scribe software for clinical note transcription with the patient, who gave verbal consent to proceed.  History of Present Illness       Previous GI Procedures/Imaging   CT A/P 02/13/2023 1. Acute appendicitis without evidence of perforation or abscess. 2. Left adnexal cyst measuring up to 4.4 cm. 3. No evidence of nephrolithiasis or hydronephrosis.   Past Medical History:  Diagnosis Date   Kidney infection    UTI (urinary tract infection)     Past Surgical History:  Procedure Laterality Date   CESAREAN SECTION     CYST EXCISION     XI ROBOTIC LAPAROSCOPIC ASSISTED APPENDECTOMY N/A 02/14/2023   Procedure: XI ROBOTIC LAPAROSCOPIC ASSISTED APPENDECTOMY;  Surgeon: Evonnie Dorothyann LABOR, DO;  Location: [facility];  Service: General;  Laterality: N/A;    Current Outpatient Medications  Medication Sig Dispense Refill   amoxicillin (AMOXIL) 500 MG capsule Take 500 mg by mouth 3 (three) times daily.     chlorhexidine  (PERIDEX ) 0.12 % solution SMARTSIG:15 By Mouth Twice Daily     cyclobenzaprine  (FLEXERIL ) 5 MG tablet cyclobenzaprine  5 mg tablet  TAKE 1/2 TABLET BY MOUTH UP TO THREE TIMES DAILY AS NEEDED     dexamethasone  (DECADRON ) 4 MG tablet Take 4 mg by mouth 3 (three) times daily as needed.     Docusate Sodium  (DSS) 100 MG CAPS TAKE ONE CAPSULE BY MOUTH TWICE DAILY FOR 10 DAYS      Doxylamine-Pyridoxine 10-10 MG TBEC doxylamine 10 mg-pyridoxine (vit B6) 10 mg tablet,delayed release  Take 2 tablets every day by oral route.     Ferrous Sulfate  (IRON PO) Take 1 tablet by mouth daily.     ferrous sulfate  325 (65 FE) MG EC tablet Take 1 tablet by mouth daily with breakfast.     ferrous sulfate  325 (65 FE) MG tablet Take 1 tablet (325 mg total) by mouth daily with breakfast. 90 tablet 3   ibuprofen  (ADVIL ) 600 MG tablet TAKE ONE TABLET BY MOUTH EVERY 6 HOURS FOR 10 DAYS     oxyCODONE  (OXY IR/ROXICODONE ) 5 MG immediate release tablet Take 1 tablet (5 mg total) by mouth every 6 (six) hours as needed for severe pain or moderate pain. 16 tablet 0   Prenatal Vit-Fe Fumarate-FA (PRENATAL MULTIVITAMIN) TABS tablet Take 1 tablet by mouth daily at 12 noon. 90 tablet 3   sulfamethoxazole -trimethoprim  (BACTRIM  DS) 800-160 MG tablet Take 1 tablet by mouth every 12 (twelve) hours. 24 tablet 0   No current facility-administered medications for this visit.    Allergies as of 09/21/2024 - Review Complete 12/20/2023  Allergen Reaction Noted   Latex Rash, Hives, and Itching 11/12/2018    No family history on file.  Social History   Tobacco Use   Smoking status: Never    Passive exposure: Yes   Smokeless tobacco: Never  Vaping Use   Vaping status: Every  Day  Substance Use Topics   Alcohol use: No   Drug use: Yes    Types: Marijuana     Review of Systems:    Constitutional: No weight loss, fever, chills, weakness or fatigue Eyes: No change in vision Ears, Nose, Throat:  No change in hearing or congestion Skin: No rash or itching Cardiovascular: No chest pain, chest pressure or palpitations   Respiratory: No SOB or cough Gastrointestinal: See HPI and otherwise negative Genitourinary: No dysuria or change in urinary frequency Neurological: No headache, dizziness or syncope Musculoskeletal: No new muscle or joint pain Hematologic: No bleeding or bruising    Physical Exam:   Vital signs: There were no vitals taken for this visit.  Constitutional: NAD, Well developed, Well nourished, alert and cooperative Head:  Normocephalic and atraumatic.  Eyes: No scleral icterus. Conjunctiva pink. Mouth: No oral lesions. Respiratory: Respirations even and unlabored. Lungs clear to auscultation bilaterally.  No wheezes, crackles, or rhonchi.  Cardiovascular:  Regular rate and rhythm. No murmurs. No peripheral edema. Gastrointestinal:  Soft, nondistended, nontender. No rebound or guarding. Normal bowel sounds. No appreciable masses or hepatomegaly. Rectal:  Not performed.  Neurologic:  Alert and oriented x4;  grossly normal neurologically.  Skin:   Dry and intact without significant lesions or rashes. Psychiatric: Oriented to person, place and time. Demonstrates good judgement and reason without abnormal affect or behaviors.   RELEVANT LABS AND IMAGING: CBC    Component Value Date/Time   WBC 6.5 02/14/2023 0430   RBC 4.27 02/14/2023 0430   HGB 12.8 02/14/2023 0430   HCT 39.3 02/14/2023 0430   PLT 249 02/14/2023 0430   MCV 92.0 02/14/2023 0430   MCH 30.0 02/14/2023 0430   MCHC 32.6 02/14/2023 0430   RDW 12.3 02/14/2023 0430   LYMPHSABS 1.3 03/15/2022 0704   MONOABS 0.5 03/15/2022 0704   EOSABS 0.1 03/15/2022 0704   BASOSABS 0.0 03/15/2022 0704    CMP     Component Value Date/Time   NA 138 02/14/2023 0430   K 3.5 02/14/2023 0430   CL 107 02/14/2023 0430   CO2 23 02/14/2023 0430   GLUCOSE 90 02/14/2023 0430   BUN 8 02/14/2023 0430   CREATININE 0.65 02/14/2023 0430   CALCIUM  8.9 02/14/2023 0430   PROT 6.6 02/14/2023 0430   ALBUMIN 3.9 02/14/2023 0430   AST 11 (L) 02/14/2023 0430   ALT 11 02/14/2023 0430   ALKPHOS 51 02/14/2023 0430   BILITOT 0.8 02/14/2023 0430   GFRNONAA >60 02/14/2023 0430   GFRAA >60 10/16/2019 1405     Assessment/Plan:    Assessment and Plan Assessment & Plan        Camie Furbish, PA-C [facility] 09/21/2024, 7:59 AM  Patient Care Team: Waddell Mitzie SAUNDERS, FNP as PCP - General (Family Medicine)

## 2024-11-09 ENCOUNTER — Ambulatory Visit: Payer: PRIVATE HEALTH INSURANCE | Admitting: Gastroenterology

## 2024-11-09 NOTE — Progress Notes (Deleted)
 "  Alexis Padilla [id number] [birth year]   Chief Complaint:  Referring Provider: Waddell Mitzie SAUNDERS, FNP Primary GI MD: Sampson  HPI: Alexis Padilla is a 23 y.o. female with past medical history of anxiety, appendectomy who presents today for a complaint of *** .    Patient is referred for evaluation of postprandial bloating and upper abdominal pain.  Discussed the use of AI scribe software for clinical note transcription with the patient, who gave verbal consent to proceed.  History of Present Illness       Previous GI Procedures/Imaging      Past Medical History:  Diagnosis Date   Kidney infection    UTI (urinary tract infection)     Past Surgical History:  Procedure Laterality Date   CESAREAN SECTION     CYST EXCISION     XI ROBOTIC LAPAROSCOPIC ASSISTED APPENDECTOMY N/A 02/14/2023   Procedure: XI ROBOTIC LAPAROSCOPIC ASSISTED APPENDECTOMY;  Surgeon: Evonnie Dorothyann LABOR, DO;  Location: [facility];  Service: General;  Laterality: N/A;    Current Outpatient Medications  Medication Sig Dispense Refill   amoxicillin (AMOXIL) 500 MG capsule Take 500 mg by mouth 3 (three) times daily.     chlorhexidine  (PERIDEX ) 0.12 % solution SMARTSIG:15 By Mouth Twice Daily     cyclobenzaprine  (FLEXERIL ) 5 MG tablet cyclobenzaprine  5 mg tablet  TAKE 1/2 TABLET BY MOUTH UP TO THREE TIMES DAILY AS NEEDED     dexamethasone  (DECADRON ) 4 MG tablet Take 4 mg by mouth 3 (three) times daily as needed.     Docusate Sodium  (DSS) 100 MG CAPS TAKE ONE CAPSULE BY MOUTH TWICE DAILY FOR 10 DAYS     Doxylamine-Pyridoxine 10-10 MG TBEC doxylamine 10 mg-pyridoxine (vit B6) 10 mg tablet,delayed release  Take 2 tablets every day by oral route.     Ferrous Sulfate  (IRON PO) Take 1 tablet by mouth daily.     ferrous sulfate  325 (65 FE) MG EC tablet Take 1 tablet by mouth daily with breakfast.     ferrous sulfate  325 (65 FE) MG tablet Take 1 tablet (325 mg total) by mouth daily with breakfast. 90 tablet  3   ibuprofen  (ADVIL ) 600 MG tablet TAKE ONE TABLET BY MOUTH EVERY 6 HOURS FOR 10 DAYS     oxyCODONE  (OXY IR/ROXICODONE ) 5 MG immediate release tablet Take 1 tablet (5 mg total) by mouth every 6 (six) hours as needed for severe pain or moderate pain. 16 tablet 0   Prenatal Vit-Fe Fumarate-FA (PRENATAL MULTIVITAMIN) TABS tablet Take 1 tablet by mouth daily at 12 noon. 90 tablet 3   sulfamethoxazole -trimethoprim  (BACTRIM  DS) 800-160 MG tablet Take 1 tablet by mouth every 12 (twelve) hours. 24 tablet 0   No current facility-administered medications for this visit.    Allergies as of 11/09/2024 - Review Complete 12/20/2023  Allergen Reaction Noted   Latex Rash, Hives, and Itching 11/12/2018    No family history on file.  Social History[1]   Review of Systems:    Constitutional: No weight loss, fever, chills, weakness or fatigue Eyes: No change in vision Ears, Nose, Throat:  No change in hearing or congestion Skin: No rash or itching Cardiovascular: No chest pain, chest pressure or palpitations   Respiratory: No SOB or cough Gastrointestinal: See HPI and otherwise negative Genitourinary: No dysuria or change in urinary frequency Neurological: No headache, dizziness or syncope Musculoskeletal: No new muscle or joint pain Hematologic: No bleeding or bruising    Physical Exam:  Vital signs: There were  no vitals taken for this visit.  Wt Readings from Last 3 Encounters:  12/20/23 127 lb (57.6 kg)  09/26/23 135 lb (61.2 kg)  02/13/23 135 lb (61.2 kg)     Constitutional: NAD, Well developed, Well nourished, alert and cooperative Head:  Normocephalic and atraumatic.  Eyes: No scleral icterus. Conjunctiva pink. Mouth: No oral lesions. Respiratory: Respirations even and unlabored. Lungs clear to auscultation bilaterally.  No wheezes, crackles, or rhonchi.  Cardiovascular:  Regular rate and rhythm. No murmurs. No peripheral edema. Gastrointestinal:  Soft, nondistended, nontender. No  rebound or guarding. Normal bowel sounds. No appreciable masses or hepatomegaly. Rectal:  Not performed.  Neurologic:  Alert and oriented x4;  grossly normal neurologically.  Skin:   Dry and intact without significant lesions or rashes. Psychiatric: Oriented to person, place and time. Demonstrates good judgement and reason without abnormal affect or behaviors.      Assessment/Plan:   Assessment & Plan        Camie Furbish, PA-C [facility] 11/09/2024, 8:15 AM  Patient Care Team: Waddell Mitzie SAUNDERS, FNP as PCP - General (Family Medicine)      [1]  Social History Tobacco Use   Smoking status: Never    Passive exposure: Yes   Smokeless tobacco: Never  Vaping Use   Vaping status: Every Day  Substance Use Topics   Alcohol use: No   Drug use: Yes    Types: Marijuana   "
